# Patient Record
Sex: Female | Born: 1960 | Race: Black or African American | Hispanic: No | Smoking: Former smoker
Health system: Southern US, Community
[De-identification: ages and names within clinical notes are randomized; demographics above are authoritative.]

## PROBLEM LIST (undated history)

## (undated) DIAGNOSIS — E785 Hyperlipidemia, unspecified: Secondary | ICD-10-CM

## (undated) DIAGNOSIS — S060XAA Concussion with loss of consciousness status unknown, initial encounter: Secondary | ICD-10-CM

## (undated) DIAGNOSIS — R600 Localized edema: Secondary | ICD-10-CM

## (undated) DIAGNOSIS — F329 Major depressive disorder, single episode, unspecified: Secondary | ICD-10-CM

## (undated) DIAGNOSIS — G43909 Migraine, unspecified, not intractable, without status migrainosus: Secondary | ICD-10-CM

## (undated) DIAGNOSIS — B20 Human immunodeficiency virus [HIV] disease: Secondary | ICD-10-CM

## (undated) DIAGNOSIS — K649 Unspecified hemorrhoids: Secondary | ICD-10-CM

## (undated) DIAGNOSIS — F209 Schizophrenia, unspecified: Secondary | ICD-10-CM

## (undated) DIAGNOSIS — Z973 Presence of spectacles and contact lenses: Secondary | ICD-10-CM

## (undated) DIAGNOSIS — I10 Essential (primary) hypertension: Secondary | ICD-10-CM

## (undated) DIAGNOSIS — Z87820 Personal history of traumatic brain injury: Secondary | ICD-10-CM

## (undated) DIAGNOSIS — E119 Type 2 diabetes mellitus without complications: Secondary | ICD-10-CM

## (undated) DIAGNOSIS — M545 Low back pain, unspecified: Secondary | ICD-10-CM

## (undated) DIAGNOSIS — F200 Paranoid schizophrenia: Secondary | ICD-10-CM

## (undated) DIAGNOSIS — M4306 Spondylolysis, lumbar region: Secondary | ICD-10-CM

## (undated) DIAGNOSIS — M1990 Unspecified osteoarthritis, unspecified site: Secondary | ICD-10-CM

## (undated) DIAGNOSIS — F319 Bipolar disorder, unspecified: Secondary | ICD-10-CM

## (undated) DIAGNOSIS — D013 Carcinoma in situ of anus and anal canal: Secondary | ICD-10-CM

## (undated) DIAGNOSIS — R0600 Dyspnea, unspecified: Secondary | ICD-10-CM

## (undated) DIAGNOSIS — J302 Other seasonal allergic rhinitis: Secondary | ICD-10-CM

## (undated) DIAGNOSIS — N939 Abnormal uterine and vaginal bleeding, unspecified: Secondary | ICD-10-CM

## (undated) DIAGNOSIS — Z21 Asymptomatic human immunodeficiency virus [HIV] infection status: Secondary | ICD-10-CM

## (undated) DIAGNOSIS — J45909 Unspecified asthma, uncomplicated: Secondary | ICD-10-CM

## (undated) DIAGNOSIS — U071 COVID-19: Secondary | ICD-10-CM

## (undated) DIAGNOSIS — B181 Chronic viral hepatitis B without delta-agent: Secondary | ICD-10-CM

## (undated) DIAGNOSIS — S060X9A Concussion with loss of consciousness of unspecified duration, initial encounter: Secondary | ICD-10-CM

## (undated) DIAGNOSIS — F32A Depression, unspecified: Secondary | ICD-10-CM

## (undated) DIAGNOSIS — Z87410 Personal history of cervical dysplasia: Secondary | ICD-10-CM

## (undated) DIAGNOSIS — K219 Gastro-esophageal reflux disease without esophagitis: Secondary | ICD-10-CM

## (undated) DIAGNOSIS — F4310 Post-traumatic stress disorder, unspecified: Secondary | ICD-10-CM

## (undated) HISTORY — PX: OTHER SURGICAL HISTORY: SHX169

## (undated) HISTORY — DX: Schizophrenia, unspecified: F20.9

## (undated) HISTORY — DX: Bipolar disorder, unspecified: F31.9

## (undated) HISTORY — PX: ABDOMINAL HYSTERECTOMY: SHX81

---

## 1988-12-13 HISTORY — PX: LAPAROSCOPIC SALPINGOOPHERECTOMY: SUR795

## 2003-06-16 DIAGNOSIS — G459 Transient cerebral ischemic attack, unspecified: Secondary | ICD-10-CM

## 2003-06-16 DIAGNOSIS — Z8673 Personal history of transient ischemic attack (TIA), and cerebral infarction without residual deficits: Secondary | ICD-10-CM

## 2003-06-16 HISTORY — DX: Personal history of transient ischemic attack (TIA), and cerebral infarction without residual deficits: Z86.73

## 2003-06-16 HISTORY — DX: Transient cerebral ischemic attack, unspecified: G45.9

## 2007-09-22 ENCOUNTER — Other Ambulatory Visit: Payer: Self-pay

## 2007-09-22 ENCOUNTER — Ambulatory Visit: Payer: Self-pay | Admitting: Obstetrics and Gynecology

## 2007-09-30 ENCOUNTER — Ambulatory Visit: Payer: Self-pay | Admitting: Obstetrics and Gynecology

## 2008-06-18 ENCOUNTER — Emergency Department: Payer: Self-pay | Admitting: Emergency Medicine

## 2010-03-05 LAB — CONVERTED CEMR LAB

## 2010-03-13 LAB — CONVERTED CEMR LAB
Cholesterol: 214 mg/dL
Creatinine, Ser: 0.77 mg/dL
Glucose, Bld: 72 mg/dL
HIV 1 RNA Quant: 78700 copies/mL
Platelets: 243 10*3/uL
Triglyceride fasting, serum: 57 mg/dL
WBC: 3.8 10*3/uL

## 2010-04-22 ENCOUNTER — Ambulatory Visit: Payer: Self-pay | Admitting: Family Medicine

## 2010-05-06 ENCOUNTER — Ambulatory Visit: Payer: Self-pay | Admitting: Adult Health

## 2010-05-06 DIAGNOSIS — R87610 Atypical squamous cells of undetermined significance on cytologic smear of cervix (ASC-US): Secondary | ICD-10-CM

## 2010-05-06 DIAGNOSIS — B20 Human immunodeficiency virus [HIV] disease: Secondary | ICD-10-CM | POA: Insufficient documentation

## 2010-05-06 LAB — CONVERTED CEMR LAB
ALT: 18 units/L (ref 0–35)
AST: 19 units/L (ref 0–37)
Albumin: 4.8 g/dL (ref 3.5–5.2)
Alkaline Phosphatase: 43 units/L (ref 39–117)
BUN: 14 mg/dL (ref 6–23)
Bacteria, UA: NONE SEEN
Basophils Absolute: 0 10*3/uL (ref 0.0–0.1)
Basophils Relative: 0 % (ref 0–1)
Bilirubin Urine: NEGATIVE
CO2: 27 meq/L (ref 19–32)
Calcium: 9.8 mg/dL (ref 8.4–10.5)
Casts: NONE SEEN /lpf
Chlamydia, Swab/Urine, PCR: NEGATIVE
Chloride: 103 meq/L (ref 96–112)
Cholesterol: 217 mg/dL — ABNORMAL HIGH (ref 0–200)
Creatinine, Ser: 0.77 mg/dL (ref 0.40–1.20)
Crystals: NONE SEEN
Eosinophils Absolute: 0 10*3/uL (ref 0.0–0.7)
Eosinophils Relative: 0 % (ref 0–5)
GC Probe Amp, Urine: NEGATIVE
Glucose, Bld: 85 mg/dL (ref 70–99)
HCT: 35.9 % — ABNORMAL LOW (ref 36.0–46.0)
HCV Ab: NEGATIVE
HDL: 64 mg/dL (ref >39)
HIV 1 RNA Quant: 73400 copies/mL — ABNORMAL HIGH (ref <20)
HIV-1 RNA Quant, Log: 4.87 — ABNORMAL HIGH (ref <1.30)
HIV-1 antibody: POSITIVE — AB
HIV-2 Ab: NEGATIVE
Hemoglobin: 11.8 g/dL — ABNORMAL LOW (ref 12.0–15.0)
Hep A Total Ab: POSITIVE — AB
Hep B Core Total Ab: NEGATIVE
Hep B S Ab: NEGATIVE
Hepatitis B Surface Ag: NEGATIVE
Ketones, ur: NEGATIVE mg/dL
LDL Cholesterol: 141 mg/dL — ABNORMAL HIGH (ref 0–99)
Leukocytes, UA: NEGATIVE
Lymphocytes Relative: 44 % (ref 12–46)
Lymphs Abs: 1.4 10*3/uL (ref 0.7–4.0)
MCHC: 32.9 g/dL (ref 30.0–36.0)
MCV: 86.3 fL (ref 78.0–100.0)
Monocytes Absolute: 0.3 10*3/uL (ref 0.1–1.0)
Monocytes Relative: 9 % (ref 3–12)
Neutro Abs: 1.4 10*3/uL — ABNORMAL LOW (ref 1.7–7.7)
Neutrophils Relative %: 47 % (ref 43–77)
Nitrite: NEGATIVE
Pap Smear: ABNORMAL
Platelets: 211 10*3/uL (ref 150–400)
Potassium: 4.2 meq/L (ref 3.5–5.3)
Protein, ur: NEGATIVE mg/dL
RBC: 4.16 M/uL (ref 3.87–5.11)
RDW: 13.7 % (ref 11.5–15.5)
Sodium: 140 meq/L (ref 135–145)
Specific Gravity, Urine: 1.016 (ref 1.005–1.030)
Total Bilirubin: 0.5 mg/dL (ref 0.3–1.2)
Total CHOL/HDL Ratio: 3.4
Total Protein: 7.8 g/dL (ref 6.0–8.3)
Triglycerides: 61 mg/dL (ref <150)
Urine Glucose: NEGATIVE mg/dL
Urobilinogen, UA: 0.2 (ref 0.0–1.0)
VLDL: 12 mg/dL (ref 0–40)
WBC: 3.1 10*3/uL — ABNORMAL LOW (ref 4.0–10.5)
pH: 6 (ref 5.0–8.0)

## 2010-05-21 ENCOUNTER — Ambulatory Visit: Payer: Self-pay | Admitting: Adult Health

## 2010-05-21 DIAGNOSIS — J45909 Unspecified asthma, uncomplicated: Secondary | ICD-10-CM | POA: Insufficient documentation

## 2010-05-21 DIAGNOSIS — K219 Gastro-esophageal reflux disease without esophagitis: Secondary | ICD-10-CM

## 2010-05-21 DIAGNOSIS — E785 Hyperlipidemia, unspecified: Secondary | ICD-10-CM | POA: Insufficient documentation

## 2010-05-21 DIAGNOSIS — Z8709 Personal history of other diseases of the respiratory system: Secondary | ICD-10-CM | POA: Insufficient documentation

## 2010-06-05 ENCOUNTER — Ambulatory Visit: Payer: Self-pay | Admitting: Adult Health

## 2010-06-05 DIAGNOSIS — K644 Residual hemorrhoidal skin tags: Secondary | ICD-10-CM | POA: Insufficient documentation

## 2010-06-20 ENCOUNTER — Encounter: Payer: Self-pay | Admitting: Adult Health

## 2010-06-23 ENCOUNTER — Ambulatory Visit
Admission: RE | Admit: 2010-06-23 | Discharge: 2010-06-23 | Payer: Self-pay | Source: Home / Self Care | Attending: Adult Health | Admitting: Adult Health

## 2010-06-23 ENCOUNTER — Encounter: Payer: Self-pay | Admitting: Adult Health

## 2010-06-23 LAB — CONVERTED CEMR LAB
ALT: 24 units/L (ref 0–35)
AST: 20 units/L (ref 0–37)
Albumin: 4.7 g/dL (ref 3.5–5.2)
Alkaline Phosphatase: 50 units/L (ref 39–117)
BUN: 13 mg/dL (ref 6–23)
Basophils Absolute: 0 10*3/uL (ref 0.0–0.1)
Basophils Relative: 0 % (ref 0–1)
CO2: 26 meq/L (ref 19–32)
Calcium: 9.5 mg/dL (ref 8.4–10.5)
Chloride: 104 meq/L (ref 96–112)
Creatinine, Ser: 0.79 mg/dL (ref 0.40–1.20)
Eosinophils Absolute: 0 10*3/uL (ref 0.0–0.7)
Eosinophils Relative: 1 % (ref 0–5)
Glucose, Bld: 89 mg/dL (ref 70–99)
HCT: 37.3 % (ref 36.0–46.0)
HIV 1 RNA Quant: 78 copies/mL — ABNORMAL HIGH (ref <20)
HIV-1 RNA Quant, Log: 1.89 — ABNORMAL HIGH (ref <1.30)
Hemoglobin: 12.1 g/dL (ref 12.0–15.0)
Lymphocytes Relative: 34 % (ref 12–46)
Lymphs Abs: 1 10*3/uL (ref 0.7–4.0)
MCHC: 32.4 g/dL (ref 30.0–36.0)
MCV: 87.1 fL (ref 78.0–100.0)
Monocytes Absolute: 0.2 10*3/uL (ref 0.1–1.0)
Monocytes Relative: 6 % (ref 3–12)
Neutro Abs: 1.8 10*3/uL (ref 1.7–7.7)
Neutrophils Relative %: 59 % (ref 43–77)
Platelets: 241 10*3/uL (ref 150–400)
Potassium: 4.6 meq/L (ref 3.5–5.3)
RBC: 4.28 M/uL (ref 3.87–5.11)
RDW: 14 % (ref 11.5–15.5)
Sodium: 141 meq/L (ref 135–145)
Total Bilirubin: 0.3 mg/dL (ref 0.3–1.2)
Total Protein: 7.8 g/dL (ref 6.0–8.3)
WBC: 3 10*3/uL — ABNORMAL LOW (ref 4.0–10.5)

## 2010-06-30 LAB — T-HELPER CELL (CD4) - (RCID CLINIC ONLY)
CD4 % Helper T Cell: 37 % (ref 33–55)
CD4 T Cell Abs: 390 uL — ABNORMAL LOW (ref 400–2700)

## 2010-07-07 ENCOUNTER — Ambulatory Visit: Admit: 2010-07-07 | Payer: Self-pay | Admitting: Adult Health

## 2010-07-08 ENCOUNTER — Telehealth: Payer: Self-pay | Admitting: Adult Health

## 2010-07-15 NOTE — Miscellaneous (Signed)
Summary: HIPAA Restrictions  HIPAA Restrictions   Imported By: Florinda Marker 05/13/2010 09:23:38  _____________________________________________________________________  External Attachment:    Type:   Image     Comment:   External Document

## 2010-07-15 NOTE — Assessment & Plan Note (Addendum)
Summary: Nurse Visit (Infectious Disease)   Infectious Disease New Patient Intake Referring MD/Agency: Dr Vevelyn Royals  Address:  [address] [place], [place] [address]  Return Appointment Date: 05/28/2010  With Physician: Traci Sermon, NP Medical Records: Received Health Insurance / Payor: Private    Do you have a Primary physician: Yes Physician Name: [facility]   City/State: [place], [place]  Are family members aware of patient's diagnosis?  If so, are they supportive? He states she has told her son.   Medical History Medical Hx Comments: Pt is not a very good historian.  She tends to ramble from subject to subject. At one point she says she does not have HIV and then says at least it can be cured.  She states history of problems with fibroids and abnormal pap smear but she has seen a specialist for this.    Family History Hypertension: Yes  Family Side: Maternal Diabetes: Yes  Family Side: Maternal  Tobacco use: current Amt: 1 per day.  Behavioral Health Assessment Have you ever been diagnosed with depression or mental illness? Yes  Diagnosis: Pt states she was seeing a Psychiatrist at some point but he said she was fine and let her go.  Do you drink alcohol? Yes Last Date of Consumption: 03/25/2010 Frequency: week end Alcohol Beverage Type(s): beer /alcohol  Do you use recreational drugs? No Do you feel you have a problem with drugs and/or alcohol? No   Have you ever been in a treatment facility for any addiction? No Behavioral Health Comments: I suspect pt does have some form of mental health disorder. She is able to maintain employment and she drives.  She is a very pleasant person with a bubbly personality. I am not sure she understands the full meaning of being diagnosed with HIV. I spent 30 mins with patient explaining in simple terms how it is transmitted , symptoms, and disease process. Yet she says I can take medications  and be cured from this right?  HIV Intake Information When did you first test positive for HIV? 03/13/2010 Where was this test performed?  Name of Agency: [facility]  City/State: [place], [place]  [place]: [place] Was this your first time ever being tested or HIV? No   LAST negative HIV test result: 09/2001 Name of Office: [facility]   City/state: [place], [place]   Risk Factor(s) for HIV: Heterosexual contact  Method of Exposure to HIV: Heterosexual Intercourse Have you ever been hospitalized for any HIV-related condition? No  Have you ever been under the care of a physician for being HIV positive? No  Newly Diagnosed Patients Has a Disease Intervention Specialist from the Health Department contacted the patient? Yes.   The patient has been informed that the [facility] will contact ALL newly reported cases. Health Department Contact:  [phone]   (SSN is needed for confirmation)  Reported Today: 05/06/2010 Health Department Contact:  [phone]            (SSN is needed for confirmation)  Person Reporting: Tammy King,RN   If yes, please explain: Poss. uterine surgery   HIV Medications Information The patient is currently NOT taking any HIV medications.  Infection History  Patient has been diagnosed with the following opportunistic infections: Are there any other symptoms you need to discuss? No Have you received literature/education prior to this visit about HIV/AIDS? No Do you understand the meaning of a Viral Load? No Do  you understand the meaning of a CD4 count? No Lab Values Education/Handout Given Yes Medication Education/Handout Given Yes  Sexual History Are you in a current relationship? Yes Are they aware of your diagnosis? Yes Have they been tested for HIV? No What were the results: Unknown Sexual History Comments: Pt is involved in a relationship with two males who both have cancer.  She states she has pain with sexual  intercourse and only performs oral sex with them. Her relationship with these 2 men has been going on for at least 4 years per the patient.  She does not know how she could have HIV when she was mainly performing oral sex and her partners all go the the [facility] for check ups.   Evaluation and Follow-Up INTAKE CHECK LIST: HIV Education, Safe Sex Counseling, HIV Material Given  Prevention For Positives: 05/06/2010   Safe sex practices discussed with patient. Condoms offered. Juanell Fairly Consent: Yes Are you in need of condoms at this time? No Our patient has been informed that condoms are always available in this clinic.   Are you involved in any social organization? [facility] Does patient have problems that warrant Social Worker referral? No  Obstetric/Gynecological History Pap Smear Date: 03/13/2010 Result:  abnormal Office/Clinic:  [facility]    Hysterectomy:  No  Pregnancy History Gravida: 3 Para: 3  Immunization History:  Influenza Immunization History:    Influenza:  historical (03/13/2010)  Immunizations Administered:  PPD Skin Test:    Vaccine Type: PPD    Site: left forearm    Mfr: Sanofi Pasteur    Dose: 0.05 ml    Route: ID    Given by: Tomasita Morrow RN    Exp. Date: 12/20/2011    Lot #: [id number]  Pneumonia Vaccine:    Vaccine Type: Pneumovax    Site: right deltoid    Mfr: Merck    Dose: 0.5 ml    Route: IM    Given by: Tomasita Morrow RN    Exp. Date: 10/15/2011    Lot #: [id number]    VIS given: 05/20/09 version given May 06, 2010.  Tetanus Vaccine:    Vaccine Type: Td    Site: left deltoid    Mfr: Sanofi Pasteur    Dose: 0.5 ml    Given by: Tomasita Morrow RN    Exp. Date: 07/11/2011    Lot #: [id number]    VIS given: 05/02/08 version given May 06, 2010.  PPD Results    Date of reading: 05/08/2010    Results: < 5mm    Interpretation: negative    -  Date:  03/13/2010    Viral Load: 78700    Glucose:  72    WBC: 3.8    Platelets: 243    Creatinine: .77    Cholesterol: 214    Triglycerides: 57    Pap Smear  Procedure date:  03/05/2010  Findings:      [facility]   Pap Smear  Procedure date:  03/05/2010  Findings:      Abnormal Pap  : epithelial cell abnormality , atypical squamous cells of undetermined significance. Fungal organism morphologically consistent with candida species are present.  F/u suggested.            Prevention For Positives: 05/06/2010   Safe sex practices discussed with patient. Condoms offered.        05/06/2010   Patient was screened for substance abuse and depression. Referal  was made as indicated.

## 2010-07-15 NOTE — Assessment & Plan Note (Signed)
Summary: New 042 Sara Mcintyre   Primary Larone Kliethermes:  Talmadge Chad NP  CC:  new patient 042.  History of Present Illness: 50 y/o African-American female diagnosed HIV (+) in October 2011 in for initial evaluation post intake.  Was tested for HIV during routine physical examination in 03/2010 by PCP.  She denies any known sequela of HIV.  She was diagnosed with cervical dysplasia and HPV infection and underwent laser surgery in October 2011 for removal of dysplastic tissue and HPV lesions as well as removal of uterine fibroids, according to her.  She claims she has been in a general state of good health, except for a period "a few years back" when she had what she was told a "viral pneumonia" from which she recovered unremarkably.  She also admits to "some weight loss" but claims it was intentional as recommended by her physicians.  Currently she voices no physical complaints.  She is a good historian and accurately describes her past history timeline.  Preventive Screening-Counseling & Management  Alcohol-Tobacco     Alcohol drinks/day: 0     Smoking Status: quit  Caffeine-Diet-Exercise     Does Patient Exercise: no      Drug Use:  no.    Past History:  Past Medical History: Asthma - as a child, not current Pneumonia, hx of - viral 2-3 years back Cervical dysplasia with HPV - 2011 Uterine cyst and PID at 27 YOA Hyperlipidemia GERD - 20 years back, not recent  Past Surgical History: Carpal tunnel release - 15 years ago GYN surgery - Right Salpingo-oopherectomy 27 yoa GYN surgery - laser ablation of HPV lesion and cervical dysplasia with uterine fibroid removal - 03/2010  Family History: Family History of Cervical cancer - mother Family History of Prostate CA 1st degree relative <50 - father Family History Hypertension - both parents  Social History: Occupation: Psychiatric nurse Single Former Smoker Alcohol use-no Drug use-no Regular exercise-no Drug Use:  no  Additional  History Menstrual Status:  irregular  Review of Systems General:  Complains of weight loss; denies chills, fatigue, fever, loss of appetite, malaise, sleep disorder, sweats, and weakness; weight loss claimed as "intentional". Eyes:  Denies blurring, discharge, double vision, eye irritation, eye pain, halos, itching, light sensitivity, red eye, vision loss-1 eye, and vision loss-both eyes; Was told by PCP she may have cataracts, but had recent eye exam without diagnosis of same.  Does wear reading glasses.. ENT:  Denies decreased hearing, difficulty swallowing, ear discharge, earache, hoarseness, nasal congestion, nosebleeds, postnasal drainage, ringing in ears, sinus pressure, and sore throat. CV:  Denies bluish discoloration of lips or nails, chest pain or discomfort, difficulty breathing at night, difficulty breathing while lying down, fainting, fatigue, leg cramps with exertion, lightheadness, near fainting, palpitations, shortness of breath with exertion, swelling of feet, swelling of hands, and weight gain. Resp:  Denies chest discomfort, chest pain with inspiration, cough, coughing up blood, excessive snoring, hypersomnolence, morning headaches, pleuritic, shortness of breath, sputum productive, and wheezing. GI:  Complains of indigestion; denies abdominal pain, bloody stools, change in bowel habits, constipation, dark tarry stools, diarrhea, excessive appetite, gas, hemorrhoids, loss of appetite, nausea, vomiting, vomiting blood, and yellowish skin color; occassioinal indigestion after eating spicy or fatty foods, but not regular or recurrent.. GU:  Denies abnormal vaginal bleeding, decreased libido, discharge, dysuria, genital sores, hematuria, incontinence, nocturia, urinary frequency, and urinary hesitancy. MS:  Complains of low back pain; denies joint pain, joint redness, joint swelling, loss of strength, mid back pain,  muscle aches, muscle , cramps, muscle weakness, stiffness, and thoracic  pain; Chronic low back pain, was told she had sciatic nerve pinched, but denies any radiculopathy.. Derm:  Denies changes in color of skin, changes in nail beds, dryness, excessive perspiration, flushing, hair loss, insect bite(s), itching, lesion(s), poor wound healing, and rash. Neuro:  Complains of numbness; denies brief paralysis, difficulty with concentration, disturbances in coordination, falling down, headaches, inability to speak, memory loss, poor balance, seizures, sensation of room spinning, tingling, tremors, visual disturbances, and weakness; Intermittent numbness to right from h/o carpal tunnel syndrome in right hand. Psych:  Denies alternate hallucination ( auditory/visual), anxiety, depression, easily angered, easily tearful, irritability, mental problems, panic attacks, sense of great danger, suicidal thoughts/plans, thoughts of violence, unusual visions or sounds, and thoughts /plans of harming others. Endo:  Denies cold intolerance, excessive hunger, excessive thirst, excessive urination, heat intolerance, polyuria, and weight change. Heme:  Denies abnormal bruising, bleeding, enlarge lymph nodes, fevers, pallor, and skin discoloration. Allergy:  Denies hives or rash, itching eyes, persistent infections, seasonal allergies, and sneezing. Exposures:  Denies HIV exposure, EBV exposure, TB exposure, exposure to sick animals, exposure to sick people, exposure to unusual animals, exposure to small children, exposure to caves/spelunking, exposure to bats, exposure to hunting/wild game, exposure to stagnant or pond water, exposure to salt water, exposure to marine animals/shellfish, animal bites, cat scratches, tick bites, eating raw eggs, eating raw chicken, eating raw fish/shellfish, blood transfusion, ingestion of well water, water vapor exposure, history of needle use/puncture, history of antibiotic use (last 2 mo.), and history of travel.  Vital Signs:  Patient profile:   50 year old  female Menstrual status:  irregular Height:      64 inches (162.56 cm) Weight:      132.25 pounds (60.11 kg) BMI:     22.78 Temp:     98.4 degrees F (36.89 degrees C) oral Pulse rate:   70 / minute BP sitting:   115 / 76  (left arm)  Vitals Entered By: Starleen Arms CMA (May 21, 2010 2:14 PM) CC: new patient 042 Is Patient Diabetic? No Pain Assessment Patient in pain? no      Nutritional Status BMI of 19 -24 = normal Nutritional Status Detail nl  Does patient need assistance? Functional Status Self care Ambulation Normal     Menstrual Status irregular   Physical Exam  General:  Well-developed,well-nourished,in no acute distress; alert,appropriate and cooperative throughout examination Head:  Normocephalic and atraumatic without obvious abnormalities. No apparent alopecia or balding. Eyes:  No corneal or conjunctival inflammation noted. EOMI. Perrla. Funduscopic exam benign, without hemorrhages, exudates or papilledema. Vision grossly normal. Ears:  External ear exam shows no significant lesions or deformities.  Otoscopic examination reveals clear canals, tympanic membranes are intact bilaterally without bulging, retraction, inflammation or discharge. Hearing is grossly normal bilaterally. Nose:  External nasal examination shows no deformity or inflammation. Nasal mucosa are pink and moist without lesions or exudates. Mouth:  Oral mucosa and oropharynx without lesions or exudates, teeth missing.   Neck:  No deformities, masses, or tenderness noted. Chest Wall:  No deformities, masses, or tenderness noted. Breasts:  No mass,nodules,thickening,tenderness,bulging,retraction,inflamation,nipple discharge or skin changes noted.  last mammogram 1 month back (reported as normal) Lungs:  Normal respiratory effort, chest expands symmetrically. Lungs are clear to auscultation, no crackles or wheezes. Heart:  Normal rate and regular rhythm. S1 and S2 normal without gallop, murmur,  click, rub or other extra sounds. Abdomen:  Bowel sounds positive,abdomen  soft and non-tender without masses, organomegaly or hernias noted. Rectal:  Deferred Genitalia:  Deferred Msk:  No deformity or scoliosis noted of thoracic or lumbar spine.   Pulses:  R and L carotid,radial,femoral,dorsalis pedis and posterior tibial pulses are full and equal bilaterally Extremities:  No clubbing, cyanosis, edema, or deformity noted with normal full range of motion of all joints.   Neurologic:  No cranial nerve deficits noted. Station and gait are normal. Plantar reflexes are down-going bilaterally. DTRs are symmetrical throughout. Sensory, motor and coordinative functions appear intact. Skin:  Intact without suspicious lesions or rashes Cervical Nodes:  No lymphadenopathy noted Axillary Nodes:  Small shoddy axillary LN's Inguinal Nodes:  No significant adenopathy Psych:  Cognition and judgment appear intact. Alert and cooperative with normal attention span and concentration. No apparent delusions, illusions, hallucinations   Impression & Recommendations:  Problem # 1:  HIV INFECTION (ICD-042) CD4 390 @ 32% with HIV RNA 73, 400 copies/ml @ log 4.87 per PCR.  Clinically no indications of opportunistic sequela noted.  Her CD4% is robust at 32%, but her total CD4 is <500 and she does have h/o cervical dysplasia and HPV infection.  This along with her age would warrant strong recommendation for ARV therapy.  We spent >20 minutes discussing in detail her HIV infection, HIV pathogenesis, and treatment strategies.  She had remarkable comprehension and understanding in terms she used regarding this discussioin and admitted she is willing to start therapy.  We reviewed treatment options with her and decided the safetst and most tolerant regimen for her would be Truvada and Isentress.  An Rx was written for each, co-pay assist cards were provided for both medications, a pill box was dispensed and the regimen  re-reviewed with good return verbalized understanding of the regimen.  She is instructed to RTC in 4 weeks for labs with a scheduled f/u in 6 weeks.  She is strongly encouraged to contact the clinic if she has any questions or problems. Orders: New Patient Level V (99205)Future Orders: T-CBC w/Diff ([id number]) ... 06/18/2010 T-CD4SP ([facility]) (CD4SP) ... 06/18/2010 T-Comprehensive Metabolic Panel [id number]) ... 06/18/2010 T-HIV Viral Load [id number]) ... 06/18/2010  Medications Added to Medication List This Visit: 1)  Isentress 400 Mg Tabs (Raltegravir potassium) .... Take one (1) tablet every twelve (12) hours 2)  Truvada 200-300 Mg Tabs (Emtricitabine-tenofovir) .... Take one (1) tablet once a day  Other Orders: Hepatitis B Vaccine >70yrs (57846) Admin 1st Vaccine ([address]295)  Patient Instructions: 1)  Take Isentress 1 tablet every 12 hours 2)  Take Truvada 1 tablet once daily 3)  Return to clinic for labs in 4 weeks 4)  Schedule follow-up visit in 6 weeks. 5)  Call clinic if you are experiencing any problems with medications. Prescriptions: TRUVADA 200-300 MG TABS (EMTRICITABINE-TENOFOVIR) Take one (1) tablet once a day  #30 x 5   Entered and Authorized by:   Talmadge Chad NP   Signed by:   Talmadge Chad NP on 05/21/2010   Method used:   Print then Give to Patient   RxID:   [id number] ISENTRESS 400 MG TABS (RALTEGRAVIR POTASSIUM) Take one (1) tablet every twelve (12) hours  #60 x 5   Entered and Authorized by:   Talmadge Chad NP   Signed by:   Talmadge Chad NP on 05/21/2010   Method used:   Print then Give to Patient   RxID:   [id number]    Immunization History:  Influenza Immunization History:    Influenza:  historical (  04/15/2010)  Immunizations Administered:  Hepatitis B Vaccine # 1:    Vaccine Type: HepB Adult    Site: left deltoid    Mfr: Merck    Dose: 0.5 ml    Route: IM    Given by: Wendall Mola CMA ( AAMA)     Exp. Date: 06/02/2012    Lot #: [id number]    VIS given: 12/30/05 version given May 21, 2010.

## 2010-07-17 NOTE — Progress Notes (Signed)
Summary: concerns about appt.   Phone Note Call from Patient   Summary of Call: Pt called today c/o does she still need to see Nida Boatman since she has seen the company nurse who suggested she get a Primary Care physician. Pt was advised she must reschedule appt with Brad and it is ok to establish with PC for other problems .  Initial call taken by: Tomasita Morrow RN,  July 08, 2010 2:36 PM
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
## 2010-07-17 NOTE — Miscellaneous (Signed)
Summary: RW update  Clinical Lists Changes  Observations: Added new observation of RWPARTICIP: Yes (06/20/2010 13:51)

## 2010-07-25 ENCOUNTER — Ambulatory Visit: Payer: BC Managed Care – PPO | Admitting: Adult Health

## 2010-07-25 ENCOUNTER — Encounter: Payer: Self-pay | Admitting: Adult Health

## 2010-08-06 NOTE — Assessment & Plan Note (Signed)
Summary: f/u lab   Vital Signs:  Patient profile:   50 year old female Menstrual status:  irregular Height:      64 inches Weight:      136.4 pounds BMI:     23.50 Temp:     97.7 degrees F oral Pulse rate:   87 / minute BP sitting:   110 / 73  (right arm)  Vitals Entered By: Alesia Morin CMA (July 25, 2010 4:02 PM) CC: follow-up visit for labs Is Patient Diabetic? No Pain Assessment Patient in pain? no      Nutritional Status BMI of 19 -24 = normal Nutritional Status Detail appetite "good"  Have you ever been in a relationship where you felt threatened, hurt or afraid?No   Does patient need assistance? Functional Status Self care Ambulation Normal Comments she has missed 7days waiting on the med delivery   Primary Provider:  Talmadge Chad NP  CC:  follow-up visit for labs.  History of Present Illness: Doing very well.  Hemorrhoid pain abated after starting Metamucil therapy.  Adherent with meds with good tolerance.  No SE's reported.  Preventive Screening-Counseling & Management  Alcohol-Tobacco     Alcohol drinks/day: 0     Smoking Status: quit  Caffeine-Diet-Exercise     Caffeine use/day: coffee, soda 1 per day     Does Patient Exercise: no  Safety-Violence-Falls     Seat Belt Use: yes      Sexual History:  currently monogamous.        Drug Use:  no.    Comments: pt accepted the condoms  Allergies (verified): No Known Drug Allergies  Social History: Sexual History:  currently monogamous  Review of Systems  The patient denies anorexia, fever, weight loss, weight gain, vision loss, decreased hearing, hoarseness, chest pain, syncope, dyspnea on exertion, peripheral edema, prolonged cough, headaches, hemoptysis, abdominal pain, melena, hematochezia, severe indigestion/heartburn, hematuria, incontinence, genital sores, muscle weakness, suspicious skin lesions, transient blindness, difficulty walking, depression, unusual weight change, abnormal  bleeding, enlarged lymph nodes, angioedema, and breast masses.    Physical Exam  General:  alert, well-developed, well-nourished, and well-hydrated.   Head:  normocephalic and atraumatic.   Eyes:  vision grossly intact, pupils equal, pupils round, and pupils reactive to light.   Ears:  R ear normal and L ear normal.   Mouth:  pharynx pink and moist, poor dentition, and teeth missing.   Neck:  supple and full ROM.   Lungs:  normal respiratory effort, no intercostal retractions, and normal breath sounds.   Heart:  normal rate, regular rhythm, no murmur, no gallop, and no rub.   Abdomen:  soft, non-tender, normal bowel sounds, no hepatomegaly, and no splenomegaly.   Msk:  normal ROM, no joint tenderness, no joint swelling, and no redness over joints.   Extremities:  No clubbing, cyanosis, edema, or deformity noted with normal full range of motion of all joints.   Neurologic:  alert & oriented X3, cranial nerves II-XII intact, strength normal in all extremities, and gait normal.   Skin:  turgor normal, color normal, no rashes, and no suspicious lesions.   Psych:  Oriented X3, memory intact for recent and remote, normally interactive, good eye contact, not anxious appearing, and not depressed appearing.      Impression & Recommendations:  Problem # 1:  HIV INFECTION (ICD-042) CD4 390@37 % showing % increase of 5% although absolute value remains unchanged.  HIV RNA 78 copies/ml per PCR showing a >3 log decline after being  on therapy for approx. 8 weeks showing good virologi response to treatment.  Will continue present management with Truvada and Isentress, repeat staging labs in 6 weeks with f/u in 2 months.  If progress continues we will increase visits to q 3months and eventually q 6 months.  Verbally acknowledged and agreed with plan.  Medications Added to Medication List This Visit: 1)  Truvada 200-300 Mg Tabs (Emtricitabine-tenofovir) .Marland Kitchen.. 1 tab once daily

## 2010-08-26 LAB — T-HELPER CELL (CD4) - (RCID CLINIC ONLY)
CD4 % Helper T Cell: 32 % — ABNORMAL LOW (ref 33–55)
CD4 T Cell Abs: 390 uL — ABNORMAL LOW (ref 400–2700)

## 2010-09-09 ENCOUNTER — Other Ambulatory Visit: Payer: BC Managed Care – PPO

## 2010-09-09 DIAGNOSIS — B20 Human immunodeficiency virus [HIV] disease: Secondary | ICD-10-CM

## 2010-09-10 LAB — COMPLETE METABOLIC PANEL WITH GFR
ALT: 23 U/L (ref 0–35)
AST: 21 U/L (ref 0–37)
Albumin: 4.5 g/dL (ref 3.5–5.2)
Alkaline Phosphatase: 56 U/L (ref 39–117)
BUN: 21 mg/dL (ref 6–23)
CO2: 25 mEq/L (ref 19–32)
Calcium: 9.2 mg/dL (ref 8.4–10.5)
Chloride: 102 mEq/L (ref 96–112)
Creat: 0.95 mg/dL (ref 0.40–1.20)
GFR, Est African American: >60 mL/min (ref >60)
GFR, Est Non African American: >60 mL/min (ref >60)
Glucose, Bld: 124 mg/dL — ABNORMAL HIGH (ref 70–99)
Potassium: 4.3 mEq/L (ref 3.5–5.3)
Sodium: 135 mEq/L (ref 135–145)
Total Bilirubin: 0.3 mg/dL (ref 0.3–1.2)
Total Protein: 7 g/dL (ref 6.0–8.3)

## 2010-09-10 LAB — CBC WITH DIFFERENTIAL/PLATELET
Basophils Absolute: 0 10*3/uL (ref 0.0–0.1)
Basophils Relative: 0 % (ref 0–1)
Eosinophils Absolute: 0 10*3/uL (ref 0.0–0.7)
Eosinophils Relative: 1 % (ref 0–5)
HCT: 36.3 % (ref 36.0–46.0)
Hemoglobin: 12 g/dL (ref 12.0–15.0)
Lymphocytes Relative: 30 % (ref 12–46)
Lymphs Abs: 1.3 10*3/uL (ref 0.7–4.0)
MCH: 28.4 pg (ref 26.0–34.0)
MCHC: 33.1 g/dL (ref 30.0–36.0)
MCV: 86 fL (ref 78.0–100.0)
Monocytes Absolute: 0.3 10*3/uL (ref 0.1–1.0)
Monocytes Relative: 7 % (ref 3–12)
Neutro Abs: 2.8 10*3/uL (ref 1.7–7.7)
Neutrophils Relative %: 62 % (ref 43–77)
Platelets: 257 10*3/uL (ref 150–400)
RBC: 4.22 MIL/uL (ref 3.87–5.11)
RDW: 13.9 % (ref 11.5–15.5)
WBC: 4.4 10*3/uL (ref 4.0–10.5)

## 2010-09-10 LAB — HIV-1 RNA QUANT-NO REFLEX-BLD
HIV 1 RNA Quant: 27 copies/mL — ABNORMAL HIGH (ref <20)
HIV-1 RNA Quant, Log: 1.43 {Log} — ABNORMAL HIGH (ref <1.30)

## 2010-09-11 LAB — T-HELPER CELL (CD4) - (RCID CLINIC ONLY)
CD4 % Helper T Cell: 44 % (ref 33–55)
CD4 T Cell Abs: 580 uL (ref 400–2700)

## 2010-09-23 ENCOUNTER — Ambulatory Visit (INDEPENDENT_AMBULATORY_CARE_PROVIDER_SITE_OTHER): Payer: BC Managed Care – PPO | Admitting: Adult Health

## 2010-09-23 ENCOUNTER — Encounter: Payer: Self-pay | Admitting: Adult Health

## 2010-09-23 VITALS — BP 121/75 | HR 67 | Temp 98.3°F | Ht 62.0 in | Wt 141.0 lb

## 2010-09-23 DIAGNOSIS — Z21 Asymptomatic human immunodeficiency virus [HIV] infection status: Secondary | ICD-10-CM

## 2010-09-23 DIAGNOSIS — B20 Human immunodeficiency virus [HIV] disease: Secondary | ICD-10-CM

## 2010-09-24 NOTE — Progress Notes (Signed)
  Subjective:    Patient ID: Sara Mcintyre, female    DOB: 1961/01/28, 50 y.o.   MRN: [id number]  HPI In for lab f/u.  Doing very well.  Adherent to meds with good tolerance.  Voices no complaints.    Review of Systems  Constitutional: Negative.   HENT: Negative.   Eyes: Negative.   Respiratory: Negative.   Cardiovascular: Negative.   Gastrointestinal: Negative.   Genitourinary: Negative.   Musculoskeletal: Negative.   Skin: Negative.   Neurological: Negative.   Hematological: Negative.   Psychiatric/Behavioral: Negative.        Objective:   Physical Exam  Constitutional: She is oriented to person, place, and time. She appears well-developed and well-nourished. No distress.  HENT:  Head: Normocephalic and atraumatic.  Right Ear: External ear normal.  Left Ear: External ear normal.  Nose: Nose normal.  Mouth/Throat: Oropharynx is clear and moist.       Fair dentition.  Eyes: Conjunctivae are normal. Pupils are equal, round, and reactive to light.  Neck: Normal range of motion. Neck supple.  Cardiovascular: Normal rate, regular rhythm, normal heart sounds and intact distal pulses.   Pulmonary/Chest: Effort normal and breath sounds normal.  Abdominal: Soft. Bowel sounds are normal.  Musculoskeletal: Normal range of motion.  Neurological: She is alert and oriented to person, place, and time. Coordination normal.  Skin: Skin is warm and dry.  Psychiatric: She has a normal mood and affect. Her behavior is normal. Judgment and thought content normal.          Assessment & Plan:  HIV:

## 2010-12-03 ENCOUNTER — Other Ambulatory Visit: Payer: Self-pay | Admitting: Licensed Clinical Social Worker

## 2010-12-03 DIAGNOSIS — B20 Human immunodeficiency virus [HIV] disease: Secondary | ICD-10-CM

## 2010-12-03 MED ORDER — RALTEGRAVIR POTASSIUM 400 MG PO TABS: 400.0000 mg | ORAL_TABLET | Freq: Two times a day (BID) | ORAL | Status: DC

## 2010-12-03 MED ORDER — EMTRICITABINE-TENOFOVIR DF 200-300 MG PO TABS: 1.0000 | ORAL_TABLET | Freq: Every day | ORAL | Status: DC

## 2011-01-15 ENCOUNTER — Other Ambulatory Visit (INDEPENDENT_AMBULATORY_CARE_PROVIDER_SITE_OTHER): Payer: BC Managed Care – PPO

## 2011-01-15 DIAGNOSIS — B20 Human immunodeficiency virus [HIV] disease: Secondary | ICD-10-CM

## 2011-01-16 LAB — CBC WITH DIFFERENTIAL/PLATELET
Basophils Absolute: 0 10*3/uL (ref 0.0–0.1)
Basophils Relative: 0 % (ref 0–1)
Eosinophils Absolute: 0 10*3/uL (ref 0.0–0.7)
Eosinophils Relative: 1 % (ref 0–5)
HCT: 38.8 % (ref 36.0–46.0)
Hemoglobin: 12.6 g/dL (ref 12.0–15.0)
Lymphocytes Relative: 36 % (ref 12–46)
Lymphs Abs: 1.6 10*3/uL (ref 0.7–4.0)
MCH: 28.8 pg (ref 26.0–34.0)
MCHC: 32.5 g/dL (ref 30.0–36.0)
MCV: 88.8 fL (ref 78.0–100.0)
Monocytes Absolute: 0.3 10*3/uL (ref 0.1–1.0)
Monocytes Relative: 6 % (ref 3–12)
Neutro Abs: 2.6 10*3/uL (ref 1.7–7.7)
Neutrophils Relative %: 57 % (ref 43–77)
Platelets: 260 10*3/uL (ref 150–400)
RBC: 4.37 MIL/uL (ref 3.87–5.11)
RDW: 13.4 % (ref 11.5–15.5)
WBC: 4.6 10*3/uL (ref 4.0–10.5)

## 2011-01-16 LAB — COMPLETE METABOLIC PANEL WITH GFR
ALT: 19 U/L (ref 0–35)
AST: 22 U/L (ref 0–37)
Albumin: 4.4 g/dL (ref 3.5–5.2)
Alkaline Phosphatase: 58 U/L (ref 39–117)
BUN: 15 mg/dL (ref 6–23)
CO2: 29 mEq/L (ref 19–32)
Calcium: 9.6 mg/dL (ref 8.4–10.5)
Chloride: 107 mEq/L (ref 96–112)
Creat: 0.96 mg/dL (ref 0.50–1.10)
GFR, Est African American: >60 mL/min (ref >60)
GFR, Est Non African American: >60 mL/min (ref >60)
Glucose, Bld: 77 mg/dL (ref 70–99)
Potassium: 4.6 mEq/L (ref 3.5–5.3)
Sodium: 143 mEq/L (ref 135–145)
Total Bilirubin: 0.3 mg/dL (ref 0.3–1.2)
Total Protein: 7.5 g/dL (ref 6.0–8.3)

## 2011-01-16 LAB — T-HELPER CELL (CD4) - (RCID CLINIC ONLY)
CD4 % Helper T Cell: 46 % (ref 33–55)
CD4 T Cell Abs: 770 uL (ref 400–2700)

## 2011-01-19 LAB — HIV-1 RNA QUANT-NO REFLEX-BLD
HIV 1 RNA Quant: <20 copies/mL (ref <20)
HIV-1 RNA Quant, Log: <1.3 {Log} (ref <1.30)

## 2011-01-26 ENCOUNTER — Other Ambulatory Visit: Payer: Self-pay | Admitting: *Deleted

## 2011-01-26 DIAGNOSIS — B20 Human immunodeficiency virus [HIV] disease: Secondary | ICD-10-CM

## 2011-01-26 MED ORDER — EMTRICITABINE-TENOFOVIR DF 200-300 MG PO TABS: 1.0000 | ORAL_TABLET | Freq: Every day | ORAL | Status: DC

## 2011-01-29 ENCOUNTER — Ambulatory Visit (INDEPENDENT_AMBULATORY_CARE_PROVIDER_SITE_OTHER): Payer: BC Managed Care – PPO | Admitting: Adult Health

## 2011-01-29 VITALS — Temp 98.0°F | Ht 62.0 in | Wt 143.0 lb

## 2011-01-29 DIAGNOSIS — B20 Human immunodeficiency virus [HIV] disease: Secondary | ICD-10-CM

## 2011-01-29 DIAGNOSIS — Z23 Encounter for immunization: Secondary | ICD-10-CM

## 2011-05-27 ENCOUNTER — Other Ambulatory Visit: Payer: Self-pay | Admitting: Infectious Diseases

## 2011-05-27 ENCOUNTER — Other Ambulatory Visit: Payer: BC Managed Care – PPO

## 2011-05-27 DIAGNOSIS — B20 Human immunodeficiency virus [HIV] disease: Secondary | ICD-10-CM

## 2011-05-28 LAB — COMPREHENSIVE METABOLIC PANEL
ALT: 22 U/L (ref 0–35)
AST: 24 U/L (ref 0–37)
Albumin: 4.8 g/dL (ref 3.5–5.2)
Alkaline Phosphatase: 57 U/L (ref 39–117)
BUN: 10 mg/dL (ref 6–23)
CO2: 28 mEq/L (ref 19–32)
Calcium: 9.3 mg/dL (ref 8.4–10.5)
Chloride: 102 mEq/L (ref 96–112)
Creat: 0.81 mg/dL (ref 0.50–1.10)
Glucose, Bld: 80 mg/dL (ref 70–99)
Potassium: 4 mEq/L (ref 3.5–5.3)
Sodium: 140 mEq/L (ref 135–145)
Total Bilirubin: 0.5 mg/dL (ref 0.3–1.2)
Total Protein: 7.1 g/dL (ref 6.0–8.3)

## 2011-05-28 LAB — CBC WITH DIFFERENTIAL/PLATELET
Basophils Absolute: 0 10*3/uL (ref 0.0–0.1)
Basophils Relative: 0 % (ref 0–1)
Eosinophils Absolute: 0 10*3/uL (ref 0.0–0.7)
Eosinophils Relative: 0 % (ref 0–5)
HCT: 38.1 % (ref 36.0–46.0)
Hemoglobin: 12.8 g/dL (ref 12.0–15.0)
Lymphocytes Relative: 31 % (ref 12–46)
Lymphs Abs: 1.4 10*3/uL (ref 0.7–4.0)
MCH: 29.3 pg (ref 26.0–34.0)
MCHC: 33.6 g/dL (ref 30.0–36.0)
MCV: 87.2 fL (ref 78.0–100.0)
Monocytes Absolute: 0.3 10*3/uL (ref 0.1–1.0)
Monocytes Relative: 8 % (ref 3–12)
Neutro Abs: 2.7 10*3/uL (ref 1.7–7.7)
Neutrophils Relative %: 61 % (ref 43–77)
Platelets: 278 10*3/uL (ref 150–400)
RBC: 4.37 MIL/uL (ref 3.87–5.11)
RDW: 13.2 % (ref 11.5–15.5)
WBC: 4.5 10*3/uL (ref 4.0–10.5)

## 2011-05-28 LAB — T-HELPER CELL (CD4) - (RCID CLINIC ONLY)
CD4 % Helper T Cell: 51 % (ref 33–55)
CD4 T Cell Abs: 760 uL (ref 400–2700)

## 2011-06-01 LAB — HIV-1 RNA QUANT-NO REFLEX-BLD
HIV 1 RNA Quant: <20 copies/mL (ref <20)
HIV-1 RNA Quant, Log: <1.3 {Log} (ref <1.30)

## 2011-07-01 ENCOUNTER — Telehealth: Payer: Self-pay | Admitting: *Deleted

## 2011-07-01 NOTE — Telephone Encounter (Signed)
I gave her a copy of her immunization record showing that she did get it

## 2011-07-07 ENCOUNTER — Telehealth: Payer: Self-pay | Admitting: *Deleted

## 2011-07-07 NOTE — Telephone Encounter (Signed)
I had given her a copy of her immunizations. She wanted to know if we had evidence of any other flu shots. Told her we did not. Suggested she call her previous md to see if he/she had some. Her company is penalizing her with a higher cost of insurance since she did not get it in time in 2011. She will call them  I also discussed the need for a pcp for non-infectious issues that arise & how having mds available 5 days a week would be a benefit. She is going to call [facility] at [facility] since she lives near there

## 2011-07-21 NOTE — Progress Notes (Signed)
Subjective:    Patient ID: Sara Mcintyre is a 51 y.o. female.  Chief Complaint: HIV Follow-up Visit Sara Mcintyre is here for follow-up of HIV infection. She is feeling better since her last visit.  She claims continued adherence to therapy with good tolerance and no complications. There are not additional complaints.    Data Review: Diagnostic studies reviewed.  Review of Systems - General ROS: negative for - fatigue, hot flashes or malaise Psychological ROS: negative for - anxiety, behavioral disorder, concentration difficulties, depression or mood swings Ophthalmic ROS: negative for - blurry vision, decreased vision or loss of vision ENT ROS: negative for - oral lesions or sore throat Endocrine ROS: negative Breast ROS: negative for breast lumps Respiratory ROS: no cough, shortness of breath, or wheezing Cardiovascular ROS: no chest pain or dyspnea on exertion Gastrointestinal ROS: no abdominal pain, change in bowel habits, or black or bloody stools Musculoskeletal ROS: negative Neurological ROS: no TIA or stroke symptoms Dermatological ROS: negative for rash and skin lesion changes  Objective:   General appearance: alert, cooperative and no distress Head: Normocephalic, without obvious abnormality, atraumatic Eyes: conjunctivae/corneas clear. PERRL, EOM's intact. Fundi benign. Ears: normal TM's and external ear canals both ears Throat: abnormal findings: dentition: poor Resp: clear to auscultation bilaterally Cardio: regular rate and rhythm, S1, S2 normal, no murmur, click, rub or gallop GI: soft, non-tender; bowel sounds normal; no masses,  no organomegaly Extremities: extremities normal, atraumatic, no cyanosis or edema Skin: Skin color, texture, turgor normal. No rashes or lesions Neurologic: Alert and oriented X 3, normal strength and tone. Normal symmetric reflexes. Normal coordination and gait Psych:  No vegetative signs or delusional behaviors noted.     Laboratory: From 01/15/2011 ,  CD4 count was 770 c/cmm @ 46 %. Viral load <20 copies/ml.     Assessment/Plan:   HIV INFECTION Clinically stable on current regimen. Continue present management.  Counseling provided on prevention of transmission of HIV. Condoms offered:  Yes Medication adherence discussed with patient.  Follow up visit in 4 months with labs 2 weeks prior to appointment. Patient verbally acknowledged information provided to them and agreed with plan of care.      Tyriana Helmkamp A. Sundra Aland, MS, [facility] [phone]  07/21/2011, 3:10 PM

## 2011-07-21 NOTE — Assessment & Plan Note (Signed)
Clinically stable on current regimen. Continue present management.  Counseling provided on prevention of transmission of HIV. Condoms offered:  Yes Medication adherence discussed with patient.  Follow up visit in 4 months with labs 2 weeks prior to appointment. Patient verbally acknowledged information provided to them and agreed with plan of care.

## 2011-07-24 ENCOUNTER — Telehealth: Payer: Self-pay | Admitting: *Deleted

## 2011-07-24 NOTE — Telephone Encounter (Signed)
Pt states she had a normal mammogram in October 2012. A few days ago she had one done on her job from a [facility] unit. They told her it was abnormal & she needed to be referred for a diagnostic mammogram. She is going to have them fax the report here. I told her we can get the order from a doctor & set up a follow up mammogram. I wwill call the pt back at [phone]

## 2011-07-27 NOTE — Telephone Encounter (Signed)
I lm telling her I have not rec'd the mammogram report yet. I asked her to have them fax it so I can set up a diagnostic asap

## 2011-07-29 ENCOUNTER — Encounter: Payer: Self-pay | Admitting: *Deleted

## 2011-07-29 NOTE — Telephone Encounter (Signed)
Phone # is not in service. Letter sent.

## 2011-08-17 ENCOUNTER — Other Ambulatory Visit: Payer: BC Managed Care – PPO

## 2011-08-27 ENCOUNTER — Encounter: Payer: Self-pay | Admitting: Internal Medicine

## 2011-08-27 ENCOUNTER — Ambulatory Visit (INDEPENDENT_AMBULATORY_CARE_PROVIDER_SITE_OTHER): Payer: BC Managed Care – PPO | Admitting: Internal Medicine

## 2011-08-27 VITALS — BP 136/74 | HR 82 | Temp 98.1°F | Wt 144.0 lb

## 2011-08-27 DIAGNOSIS — B20 Human immunodeficiency virus [HIV] disease: Secondary | ICD-10-CM

## 2011-08-27 DIAGNOSIS — Z21 Asymptomatic human immunodeficiency virus [HIV] infection status: Secondary | ICD-10-CM

## 2011-08-27 LAB — URINALYSIS
Bilirubin Urine: NEGATIVE
Glucose, UA: NEGATIVE mg/dL
Hgb urine dipstick: NEGATIVE
Ketones, ur: NEGATIVE mg/dL
Leukocytes, UA: NEGATIVE
Nitrite: NEGATIVE
Protein, ur: NEGATIVE mg/dL
Specific Gravity, Urine: 1.025 (ref 1.005–1.030)
Urobilinogen, UA: 0.2 mg/dL (ref 0.0–1.0)
pH: 7 (ref 5.0–8.0)

## 2011-08-27 LAB — CBC WITH DIFFERENTIAL/PLATELET
Basophils Absolute: 0 10*3/uL (ref 0.0–0.1)
Basophils Relative: 1 % (ref 0–1)
Eosinophils Absolute: 0 10*3/uL (ref 0.0–0.7)
Eosinophils Relative: 1 % (ref 0–5)
HCT: 38.1 % (ref 36.0–46.0)
Hemoglobin: 12.5 g/dL (ref 12.0–15.0)
Lymphocytes Relative: 34 % (ref 12–46)
Lymphs Abs: 1.1 10*3/uL (ref 0.7–4.0)
MCH: 28.4 pg (ref 26.0–34.0)
MCHC: 32.8 g/dL (ref 30.0–36.0)
MCV: 86.6 fL (ref 78.0–100.0)
Monocytes Absolute: 0.3 10*3/uL (ref 0.1–1.0)
Monocytes Relative: 9 % (ref 3–12)
Neutro Abs: 1.8 10*3/uL (ref 1.7–7.7)
Neutrophils Relative %: 56 % (ref 43–77)
Platelets: 264 10*3/uL (ref 150–400)
RBC: 4.4 MIL/uL (ref 3.87–5.11)
RDW: 12.9 % (ref 11.5–15.5)
WBC: 3.2 10*3/uL — ABNORMAL LOW (ref 4.0–10.5)

## 2011-08-27 LAB — COMPREHENSIVE METABOLIC PANEL
ALT: 19 U/L (ref 0–35)
AST: 22 U/L (ref 0–37)
Albumin: 4.7 g/dL (ref 3.5–5.2)
Alkaline Phosphatase: 60 U/L (ref 39–117)
BUN: 15 mg/dL (ref 6–23)
CO2: 27 mEq/L (ref 19–32)
Calcium: 9.4 mg/dL (ref 8.4–10.5)
Chloride: 106 mEq/L (ref 96–112)
Creat: 0.82 mg/dL (ref 0.50–1.10)
Glucose, Bld: 100 mg/dL — ABNORMAL HIGH (ref 70–99)
Potassium: 4.1 mEq/L (ref 3.5–5.3)
Sodium: 142 mEq/L (ref 135–145)
Total Bilirubin: 0.4 mg/dL (ref 0.3–1.2)
Total Protein: 7.5 g/dL (ref 6.0–8.3)

## 2011-08-27 LAB — LIPID PANEL
Cholesterol: 249 mg/dL — ABNORMAL HIGH (ref 0–200)
HDL: 82 mg/dL (ref >39)
LDL Cholesterol: 156 mg/dL — ABNORMAL HIGH (ref 0–99)
Total CHOL/HDL Ratio: 3 Ratio
Triglycerides: 54 mg/dL (ref <150)
VLDL: 11 mg/dL (ref 0–40)

## 2011-08-27 LAB — RPR

## 2011-08-27 NOTE — Progress Notes (Signed)
HIV CLINIC VISIT  RFV: routine visit/re-establishing care  Subjective:    Patient ID: Sara Mcintyre, female    DOB: 03/15/1961, 51 y.o.   MRN: [id number]  HPI Sara Mcintyre is 51yo AAF with HIV, CD 4 count of 760 (51%)/VL < 20 in 12/12 on RAL/truvada, however she abruptly stopped taking her ART thinking it was causing her to have bilateral feet pain. She states that she had difficulty getting through to clinic thus she waited until her regular visit appt. She states her feet pain has now spontaneously improved. She denies numbness or burning but thought it was sharp pain to bottom of her feet, which caused occasional cramping to her toes. Otherwise, no f/chills/nightsweats/cough/sore throat/diarrhea. She does suffer from constipation, but she thought it was due to her HIV meds, although she is also taking calcium supplements.   She has had history of abn. Pap, last done in Oct 2012, reportedly normal, but we have no records. She also states that she had to get repeat Mammo at [facility] since she initially had abnormal left mammo from [facility]. We will have to get copies of these records  Regarding her overall health, she takes mvi, calcium-vit d, and fish oil regularly. Regularly excercises. Today , she brings in a paper from work to promote health/fitness, to record weight and BP.  Active Ambulatory Problems    Diagnosis Date Noted  . HIV INFECTION 05/06/2010  . HYPERLIPIDEMIA 05/21/2010  . ASTHMA 05/21/2010  . GERD 05/21/2010  . PAP SMEAR, ABNORMAL, ASCUS 05/06/2010  . PNEUMONIA, HX OF 05/21/2010  . EXTERNAL HEMORRHOIDS WITHOUT MENTION COMP 06/05/2010   Resolved Ambulatory Problems    Diagnosis Date Noted  . No Resolved Ambulatory Problems   No Additional Past Medical History   Prior to Admission medications   Medication Sig Start Date End Date Taking? Authorizing Provider  emtricitabine-tenofovir (TRUVADA) 200-300 MG per tablet Take 1 tablet by mouth  daily. 01/26/11  Yes Carolin Guernsey, NP  fish oil-omega-3 fatty acids 1000 MG capsule Take 2 g by mouth daily.     Yes Historical Provider, MD  Multiple Vitamins-Minerals (CENTRUM ULTRA WOMENS) TABS Take by mouth daily.     Yes Historical Provider, MD  raltegravir (ISENTRESS) 400 MG tablet Take 1 tablet (400 mg total) by mouth 2 (two) times daily. 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 No smoking. She has rare alcohol- to occassional alcohol use, 1 bottle of wine may last 2 wks. She is currently in a discordant relationship with HIV - female since 10/2010. They have unprotected sex since her partner does not want to use condoms. She exercises regularly with walking, 2 sessions of 30-90minutes per week.  Family hx = heart disease, and diabetes  Review of Systems Constitutional: Negative for fever, chills, diaphoresis, activity change, appetite change, fatigue and unexpected weight change.  HENT: Negative for congestion, sore throat, rhinorrhea, sneezing, trouble swallowing and sinus pressure.  Eyes: Negative for photophobia and visual disturbance.  Respiratory: Negative for cough, chest tightness, shortness of breath, wheezing and stridor.  Cardiovascular: Negative for chest pain, palpitations and leg swelling.  Gastrointestinal: positive for constipation.Negative for nausea, vomiting, abdominal pain, diarrhea,  blood in stool, abdominal distention and anal bleeding.  Genitourinary: Negative for dysuria, hematuria, flank pain and difficulty urinating.  Musculoskeletal: Negative for myalgias, back pain, joint  swelling, arthralgias and gait problem.  Skin: Negative for color change, pallor, rash and wound.  Neurological: Negative for dizziness, tremors, weakness and light-headedness.  Hematological: Negative for adenopathy. Does not bruise/bleed easily.    Psychiatric/Behavioral: Negative for behavioral problems, confusion, sleep disturbance, dysphoric mood, decreased concentration and agitation.       Objective:   Physical Exam  BP 136/74  Pulse 82  Temp(Src) 98.1 F (36.7 C) (Oral)  Wt 144 lb (65.318 kg)   General Appearance:    Alert, cooperative, no distress, appears stated age  Head:    Normocephalic, without obvious abnormality, atraumatic  Eyes:    PERRL, conjunctiva/corneas clear, EOM's intact,   Ears:    Normal TM's and external ear canals, both ears  Nose:   Nares normal, septum midline, mucosa normal, no drainage    or sinus tenderness  Throat:   Lips, mucosa, and tongue normal; poor dentition. No OP erythema  Neck:   Supple, symmetrical, trachea midline, no adenopathy;        Lungs:     Clear to auscultation bilaterally, respirations unlabored      Heart:    Regular rate and rhythm, S1 and S2 normal, no murmur, rub   or gallop     Abdomen:     Soft, non-tender, bowel sounds active all four quadrants,    no masses, no organomegaly        Extremities:   Extremities normal, atraumatic, no cyanosis or edema  Pulses:   2+ and symmetric all extremities  Skin:   Skin color, texture, turgor normal, no rashes or lesions  Lymph nodes:   Cervical, supraclavicular, and axillary nodes normal  Neurologic:   CNII-XII intact, normal strength, sensation and reflexes    throughout       Assessment & Plan:  HIV = discussed with the patient that she should not stop her meds without consulting [place] first. That her feet pain was likely something else, unrelated, such as plantar fasciitis. She is instructed to restart truvada daily/ral BID. Call if recurrent feet pain  Health maintenance= will check lipids, ua, gc/chlam, rpr, hep b/c to see if she needs HBV vaccine series  Women's health = will get copies of pap and mammo to see if she needs re-schedule of her testing prior to oc 2013  hiv prevention= have provided condoms,  reiterate that she can transmit hiv to her partner, especially now since she is off her HIV meds.  rtc in 1 month  Will give dental referral

## 2011-08-28 LAB — GC/CHLAMYDIA PROBE AMP, URINE
Chlamydia, Swab/Urine, PCR: NEGATIVE
GC Probe Amp, Urine: NEGATIVE

## 2011-08-28 LAB — T-HELPER CELL (CD4) - (RCID CLINIC ONLY)
CD4 % Helper T Cell: 38 % (ref 33–55)
CD4 T Cell Abs: 410 uL (ref 400–2700)

## 2011-08-28 LAB — HEPATITIS B SURFACE ANTIBODY,QUALITATIVE: Hep B S Ab: NEGATIVE

## 2011-08-28 LAB — HEPATITIS B SURFACE ANTIGEN: Hepatitis B Surface Ag: POSITIVE — AB

## 2011-08-28 LAB — HEPATITIS C ANTIBODY: HCV Ab: NEGATIVE

## 2011-08-31 LAB — HIV-1 RNA QUANT-NO REFLEX-BLD
HIV 1 RNA Quant: 25518 copies/mL — ABNORMAL HIGH (ref <20)
HIV-1 RNA Quant, Log: 4.41 {Log} — ABNORMAL HIGH (ref <1.30)

## 2011-09-03 ENCOUNTER — Telehealth: Payer: Self-pay | Admitting: *Deleted

## 2011-09-03 NOTE — Telephone Encounter (Signed)
Mardene Celeste, RN called from the health dept. Doreene Adas, RN had asked for our OV notes & lab results related to her positive Hep. She asked Mr Pat Kocher to follow up & see if pt has been told she is positive. I told him I do not see documentation that she has been told. Since labs were done on the same day she was seen, she may not. Her MD is on vacation this week. He is going to contact the pt & tell her.

## 2011-09-10 ENCOUNTER — Telehealth: Payer: Self-pay | Admitting: *Deleted

## 2011-09-10 NOTE — Telephone Encounter (Signed)
Gareth Eagle, RN, [place], Communicable Disease, requesting Dr. Drue Second to discuss Hep B lab results with the pt.  Pt's next [facility] appt is 09/29/11.  Please fax OV notes from that appt to [facility].

## 2011-09-29 ENCOUNTER — Ambulatory Visit: Payer: BC Managed Care – PPO | Admitting: Internal Medicine

## 2011-10-06 ENCOUNTER — Telehealth: Payer: Self-pay | Admitting: *Deleted

## 2011-10-06 NOTE — Telephone Encounter (Signed)
Pt "no showed" for appt w/ Dr. Drue Second.  Dr. Drue Second was to have discussed the pt's Hep B lab results with the pt.  Mr. Sara Mcintyre informed that pt was not seen.  [facility] RN will attempt to contact the pt and reschedule the appt with Dr. Drue Second.    Phone call to the pt.  Message left to call [facility] to make a new return appt w/ Dr. Drue Second.  Address and phone number given.

## 2011-11-03 ENCOUNTER — Encounter: Payer: Self-pay | Admitting: Internal Medicine

## 2011-11-03 ENCOUNTER — Ambulatory Visit (INDEPENDENT_AMBULATORY_CARE_PROVIDER_SITE_OTHER): Payer: BC Managed Care – PPO | Admitting: Internal Medicine

## 2011-11-03 VITALS — BP 122/73 | HR 59 | Temp 98.1°F | Wt 145.0 lb

## 2011-11-03 DIAGNOSIS — B20 Human immunodeficiency virus [HIV] disease: Secondary | ICD-10-CM

## 2011-11-05 ENCOUNTER — Telehealth: Payer: Self-pay | Admitting: *Deleted

## 2011-11-05 ENCOUNTER — Other Ambulatory Visit: Payer: Self-pay | Admitting: *Deleted

## 2011-11-05 DIAGNOSIS — B20 Human immunodeficiency virus [HIV] disease: Secondary | ICD-10-CM

## 2011-11-05 MED ORDER — EMTRICITABINE-TENOFOVIR DF 200-300 MG PO TABS: 1.0000 | ORAL_TABLET | Freq: Every day | ORAL | Status: DC

## 2011-11-05 MED ORDER — RALTEGRAVIR POTASSIUM 400 MG PO TABS: 400.0000 mg | ORAL_TABLET | Freq: Two times a day (BID) | ORAL | Status: DC

## 2011-11-05 NOTE — Telephone Encounter (Signed)
Called the patient Rx insurance carrier ([employer]) to give them the information about the patient [employer] card information.    [employer] Marketing executive) information isVickie Epley) information is ZOX=[id number]     [id number] [id number]    MVH=[id number] PCN=[employer]     PCN=[employer] MW=[id number]    UV=[phone]

## 2011-11-06 ENCOUNTER — Other Ambulatory Visit: Payer: Self-pay | Admitting: *Deleted

## 2011-11-06 DIAGNOSIS — B20 Human immunodeficiency virus [HIV] disease: Secondary | ICD-10-CM

## 2011-11-06 MED ORDER — RALTEGRAVIR POTASSIUM 400 MG PO TABS: 400.0000 mg | ORAL_TABLET | Freq: Two times a day (BID) | ORAL | Status: DC

## 2011-11-06 NOTE — Telephone Encounter (Signed)
[employer] called to see if #30 isentress per month was correct. I changed it to #60 as it is BID. Gave this to pharmacist & re-sent electronically

## 2011-12-02 ENCOUNTER — Telehealth: Payer: Self-pay | Admitting: *Deleted

## 2011-12-02 NOTE — Telephone Encounter (Signed)
She had a form with her from [facility]. She needed to gather info & have it back to them by October 2013 to qualify for a lower weekly cost of insurance. She has a pap scheduled here and I offered to print labs,immunizations, etc. She did not want that. Wanted a breast exam & labs. I suggested she find a pcp for her healthcare other than her HIV. She agreed. I gave her names of mds in this building & suggested she see if they are taking new pts.  She is due back here in September for labs & md visit.

## 2011-12-11 ENCOUNTER — Encounter: Payer: Self-pay | Admitting: *Deleted

## 2011-12-11 ENCOUNTER — Ambulatory Visit: Payer: BC Managed Care – PPO

## 2011-12-31 ENCOUNTER — Telehealth: Payer: Self-pay | Admitting: *Deleted

## 2011-12-31 NOTE — Telephone Encounter (Signed)
Call to [facility] to find out if pt is receiving Primary Care there and PAP smears.  The receptionist there checked their records and the pt has not been there since she was referred to [facility] in 2011.  They have attempted multiple contacts and have had no response.  Patient does not have a current PAP smear report.  Pt does have a return appt w/ Dr. Drue Second in September.  Will note the need for PAP smear in the future appt.

## 2012-02-01 ENCOUNTER — Other Ambulatory Visit: Payer: Self-pay | Admitting: *Deleted

## 2012-02-01 DIAGNOSIS — B20 Human immunodeficiency virus [HIV] disease: Secondary | ICD-10-CM

## 2012-02-01 MED ORDER — EMTRICITABINE-TENOFOVIR DF 200-300 MG PO TABS: 1.0000 | ORAL_TABLET | Freq: Every day | ORAL | Status: DC

## 2012-02-01 MED ORDER — RALTEGRAVIR POTASSIUM 400 MG PO TABS: 400.0000 mg | ORAL_TABLET | Freq: Two times a day (BID) | ORAL | Status: DC

## 2012-02-22 ENCOUNTER — Other Ambulatory Visit: Payer: Self-pay | Admitting: Internal Medicine

## 2012-02-22 DIAGNOSIS — B20 Human immunodeficiency virus [HIV] disease: Secondary | ICD-10-CM

## 2012-02-24 ENCOUNTER — Telehealth: Payer: Self-pay | Admitting: *Deleted

## 2012-02-24 ENCOUNTER — Other Ambulatory Visit: Payer: BC Managed Care – PPO

## 2012-02-24 ENCOUNTER — Encounter: Payer: Self-pay | Admitting: *Deleted

## 2012-02-24 DIAGNOSIS — B20 Human immunodeficiency virus [HIV] disease: Secondary | ICD-10-CM

## 2012-02-24 LAB — COMPREHENSIVE METABOLIC PANEL
Albumin: 4.5 g/dL (ref 3.5–5.2)
BUN: 18 mg/dL (ref 6–23)
CO2: 25 mEq/L (ref 19–32)
Calcium: 9.3 mg/dL (ref 8.4–10.5)
Chloride: 106 mEq/L (ref 96–112)
Glucose, Bld: 88 mg/dL (ref 70–99)
Potassium: 3.9 mEq/L (ref 3.5–5.3)
Total Protein: 7.3 g/dL (ref 6.0–8.3)

## 2012-02-24 LAB — CBC WITH DIFFERENTIAL/PLATELET
HCT: 39.3 % (ref 36.0–46.0)
Hemoglobin: 13.1 g/dL (ref 12.0–15.0)
Lymphocytes Relative: 40 % (ref 12–46)
Lymphs Abs: 1.2 10*3/uL (ref 0.7–4.0)
MCHC: 33.3 g/dL (ref 30.0–36.0)
Monocytes Absolute: 0.3 10*3/uL (ref 0.1–1.0)
Monocytes Relative: 9 % (ref 3–12)
Neutro Abs: 1.5 10*3/uL — ABNORMAL LOW (ref 1.7–7.7)
RBC: 4.6 MIL/uL (ref 3.87–5.11)
WBC: 3.1 10*3/uL — ABNORMAL LOW (ref 4.0–10.5)

## 2012-02-24 NOTE — Telephone Encounter (Signed)
Patient called and advised she has not received her shipment of Truvada for this month. She has her Isentress and is aware not to take it until she gets her truvada. She asked for help trying to get through to the insurance company. She advised she has called several times and can not get any answers. Advised her will call and check on this for her.  Called [employer] and was advised that the patients account is now serviced by [facility] and given a number to call [phone]. Called this number and was advised that the patient needs a prior auth for these medications. Asked the operator if they sent any notice of prior auth to [place] because we have not received anything. She advised no just D/C the Rx and notice sent to the patient. Then transferred to the prior auth department [phone]. Once there advised that the Rx were D/C due to balance on the acct. Advised they will reinstate the Rx but the patient will have to call to have shipment set up. Asked if they will take copay assistance cards and was told yes but to give the patient [phone] to set things up and give them the information.  Called the patient and had to leave a message for her to call us back. Will advise her of the number and give her copay cards to activate prior to calling time back. [phone]

## 2012-02-25 LAB — HIV-1 RNA QUANT-NO REFLEX-BLD: HIV-1 RNA Quant, Log: <1.3 {Log} (ref <1.30)

## 2012-02-25 LAB — T-HELPER CELL (CD4) - (RCID CLINIC ONLY): CD4 % Helper T Cell: 41 % (ref 33–55)

## 2012-03-09 ENCOUNTER — Ambulatory Visit: Payer: BC Managed Care – PPO | Admitting: Internal Medicine

## 2012-03-11 ENCOUNTER — Ambulatory Visit (INDEPENDENT_AMBULATORY_CARE_PROVIDER_SITE_OTHER): Payer: BC Managed Care – PPO | Admitting: *Deleted

## 2012-03-11 ENCOUNTER — Ambulatory Visit: Payer: BC Managed Care – PPO

## 2012-03-11 DIAGNOSIS — Z124 Encounter for screening for malignant neoplasm of cervix: Secondary | ICD-10-CM

## 2012-03-11 NOTE — Patient Instructions (Addendum)
Your results will be ready in about a week.  I will mail them to you.  Thank you for coming to the Center for your care.  Patrece Tallie,  RN 

## 2012-03-11 NOTE — Progress Notes (Signed)
  Subjective:     Sara Mcintyre is a 51 y.o. woman who comes in today for a  pap smear only.  Contraception:condoms.  Pt stated that she had "laser" surgery for "fibroids" 5 years ago.  Objective:    There were no vitals taken for this visit. Pelvic Exam:  Pap smear obtained.   Assessment:    Screening pap smear.   Plan:    Follow up in one year, or as indicated by Pap results. Pt given condoms. Pt given educational materials re: HIV and women, heart disease, nutrition, diet, exercise, PAP smears, self-esteem, and partner protection. BSE card given.

## 2012-03-16 ENCOUNTER — Encounter: Payer: Self-pay | Admitting: *Deleted

## 2012-03-21 NOTE — Progress Notes (Signed)
HIV CLINIC NOTE   RFV: routine followup Subjective:    Patient ID: Sara Mcintyre, female    DOB: January 17, 1961, 51 y.o.   MRN: [id number]  HPI Sara Mcintyre isa 51 yo F with HIV, CD 4 count of 410 (38%)/VL 25,518 ( in dec cd 4 count of 716/ LV ,20), previously on raltegravir and truvada.She stopped her ART and ran out of medications last month.  No fever, chills, nightsweats, rash, diarrhea, nausea, headache, arthralgias. Current Outpatient Prescriptions on File Prior to Visit  Medication Sig Dispense Refill  . fish oil-omega-3 fatty acids 1000 MG capsule Take 2 g by mouth daily.        . Multiple Vitamins-Minerals (CENTRUM ULTRA WOMENS) TABS Take by mouth daily.        Marland Kitchen emtricitabine-tenofovir (TRUVADA) 200-300 MG per tablet Take 1 tablet by mouth daily.  90 tablet  4  . raltegravir (ISENTRESS) 400 MG tablet Take 1 tablet (400 mg total) by mouth 2 (two) times daily.  180 tablet  4   Active Ambulatory Problems    Diagnosis Date Noted  . HIV INFECTION 05/06/2010  . HYPERLIPIDEMIA 05/21/2010  . ASTHMA 05/21/2010  . GERD 05/21/2010  . PAP SMEAR, ABNORMAL, ASCUS 05/06/2010  . PNEUMONIA, HX OF 05/21/2010  . EXTERNAL HEMORRHOIDS WITHOUT MENTION COMP 06/05/2010   Resolved Ambulatory Problems    Diagnosis Date Noted  . No Resolved Ambulatory Problems   No Additional Past Medical History   Soc= History  Substance Use Topics  . Smoking status: Never Smoker   . Smokeless tobacco: Never Used  . 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  appears well-developed and well-nourished. No distress.  HENT:  Mouth/Throat: Oropharynx is clear and moist. No oropharyngeal exudate.  Cardiovascular: Normal  rate, regular rhythm and normal heart sounds. Exam reveals no gallop and no friction rub.  No murmur heard.  Pulmonary/Chest: Effort normal and breath sounds normal. No respiratory distress.  no wheezes.  Abdominal: Soft. Bowel sounds are normal.  exhibits no distension. There is no tenderness.  Lymphadenopathy:  no cervical adenopathy.  Skin: Skin is warm and dry. No rash noted. No erythema.  Psychiatric:  normal mood and affect. His behavior is normal.      Assessment & Plan:  HIV= will need to establish her with adap or also see how we can get her back onto truvada and raltegravir. Will check cd 4 count and VL today  Chronic hep B= will check HBVL, making sure she does not have flare since stopping meds  Health maintenance =will check PPD at this visit  rtc in 4-6 wks to see how she is doing back on regimen.   Will refer to [facility] to help with adherence.  rtc in 4-6 wk

## 2012-03-22 ENCOUNTER — Ambulatory Visit (INDEPENDENT_AMBULATORY_CARE_PROVIDER_SITE_OTHER): Payer: BC Managed Care – PPO | Admitting: Internal Medicine

## 2012-03-22 ENCOUNTER — Encounter: Payer: Self-pay | Admitting: Internal Medicine

## 2012-03-22 VITALS — BP 133/82 | HR 61 | Temp 98.0°F | Wt 142.0 lb

## 2012-03-22 DIAGNOSIS — B181 Chronic viral hepatitis B without delta-agent: Secondary | ICD-10-CM

## 2012-03-22 DIAGNOSIS — Z23 Encounter for immunization: Secondary | ICD-10-CM

## 2012-03-22 NOTE — Progress Notes (Signed)
  Subjective:    Patient ID: Sara Mcintyre, female    DOB: 10-May-1961, 51 y.o.   MRN: [id number]  HPI 51 yo Female, with HIV, CD 4 count of 480, VL < 20. On truvada, and raltegravir. Missing   Current Outpatient Prescriptions on File Prior to Visit  Medication Sig Dispense Refill  . emtricitabine-tenofovir (TRUVADA) 200-300 MG per tablet Take 1 tablet by mouth daily.  90 tablet  4  . fish oil-omega-3 fatty acids 1000 MG capsule Take 2 g by mouth daily.        . Multiple Vitamins-Minerals (CENTRUM ULTRA WOMENS) TABS Take by mouth daily.        . raltegravir (ISENTRESS) 400 MG tablet Take 1 tablet (400 mg total) by mouth 2 (two) times daily.  180 tablet  4   Active Ambulatory Problems    Diagnosis Date Noted  . HIV INFECTION 05/06/2010  . HYPERLIPIDEMIA 05/21/2010  . ASTHMA 05/21/2010  . GERD 05/21/2010  . PAP SMEAR, ABNORMAL, ASCUS 05/06/2010  . PNEUMONIA, HX OF 05/21/2010  . EXTERNAL HEMORRHOIDS WITHOUT MENTION COMP 06/05/2010   Resolved Ambulatory Problems    Diagnosis Date Noted  . No Resolved Ambulatory Problems   No Additional Past Medical History       social hx = a bottle of wine lasts  Review of Systems     Objective:   Physical Exam BP 133/82  Pulse 61  Temp 98 F (36.7 C) (Oral)  Wt 142 lb (64.411 kg) Physical Exam  Constitutional:  oriented to person, place, and time.  appears well-developed and well-nourished. No distress.  HENT: poor dentition Mouth/Throat: Oropharynx is clear and moist. No oropharyngeal exudate.  Cardiovascular: Normal rate, regular rhythm and normal heart sounds. Exam reveals no gallop and no friction rub. No murmur heard.  Pulmonary/Chest: Effort normal and breath sounds normal. No respiratory distress. He has no wheezes.  Abdominal: Soft. Bowel sounds are normal.  no distension. There is no tenderness.  Lymphadenopathy:  no cervical adenopathy.   Skin: Skin is warm and dry. No rash noted. No erythema.       Assessment & Plan:    HIV = contiue on raltegravir and truvada; encourage better adherence  Hyperlipidemia = continue with fish oil  Health maintenance = getting flu shot today.  Dental referral   Hep b serology =since recent hep b s ag +, will check Heb B c Ab, Hep B s Ab, HBV VL  rtc in 3 months

## 2012-03-23 LAB — HEPATITIS B SURFACE ANTIBODY,QUALITATIVE: Hep B S Ab: POSITIVE — AB

## 2012-03-23 LAB — HEPATITIS B CORE ANTIBODY, TOTAL: Hep B Core Total Ab: POSITIVE — AB

## 2012-03-24 LAB — HEPATITIS B DNA, ULTRAQUANTITATIVE, PCR: Hepatitis B DNA: NOT DETECTED IU/mL (ref <20)

## 2012-04-05 ENCOUNTER — Ambulatory Visit: Payer: Self-pay

## 2012-11-11 ENCOUNTER — Encounter (HOSPITAL_COMMUNITY): Payer: Self-pay | Admitting: *Deleted

## 2012-11-11 ENCOUNTER — Emergency Department (HOSPITAL_COMMUNITY)
Admission: EM | Admit: 2012-11-11 | Discharge: 2012-11-11 | Disposition: A | Discharge status: Home / Self Care | Payer: No Typology Code available for payment source | Attending: Emergency Medicine | Admitting: Emergency Medicine

## 2012-11-11 DIAGNOSIS — S4980XA Other specified injuries of shoulder and upper arm, unspecified arm, initial encounter: Secondary | ICD-10-CM | POA: Insufficient documentation

## 2012-11-11 DIAGNOSIS — S79929A Unspecified injury of unspecified thigh, initial encounter: Secondary | ICD-10-CM | POA: Insufficient documentation

## 2012-11-11 DIAGNOSIS — S46909A Unspecified injury of unspecified muscle, fascia and tendon at shoulder and upper arm level, unspecified arm, initial encounter: Secondary | ICD-10-CM | POA: Insufficient documentation

## 2012-11-11 DIAGNOSIS — Y92410 Unspecified street and highway as the place of occurrence of the external cause: Secondary | ICD-10-CM | POA: Insufficient documentation

## 2012-11-11 DIAGNOSIS — M791 Myalgia, unspecified site: Secondary | ICD-10-CM

## 2012-11-11 DIAGNOSIS — S79919A Unspecified injury of unspecified hip, initial encounter: Secondary | ICD-10-CM | POA: Insufficient documentation

## 2012-11-11 DIAGNOSIS — Y9389 Activity, other specified: Secondary | ICD-10-CM | POA: Insufficient documentation

## 2012-11-11 MED ORDER — HYDROCODONE-ACETAMINOPHEN 5-325 MG PO TABS: 1.0000 | ORAL_TABLET | ORAL | Status: DC | PRN

## 2012-11-11 MED ORDER — CYCLOBENZAPRINE HCL 10 MG PO TABS: 10.0000 mg | ORAL_TABLET | Freq: Two times a day (BID) | ORAL | Status: DC | PRN
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
## 2012-11-12 NOTE — ED Provider Notes (Signed)
Medical screening examination/treatment/procedure(s) were performed by non-physician practitioner and as supervising physician I was immediately available for consultation/collaboration.  Shamika Pedregon, MD 11/12/12 0031 

## 2012-11-15 ENCOUNTER — Encounter (HOSPITAL_COMMUNITY): Payer: Self-pay | Admitting: Emergency Medicine

## 2012-11-15 ENCOUNTER — Emergency Department (HOSPITAL_COMMUNITY)
Admission: EM | Admit: 2012-11-15 | Discharge: 2012-11-15 | Disposition: A | Discharge status: Home / Self Care | Payer: No Typology Code available for payment source | Attending: Emergency Medicine | Admitting: Emergency Medicine

## 2012-11-15 DIAGNOSIS — Z8619 Personal history of other infectious and parasitic diseases: Secondary | ICD-10-CM | POA: Insufficient documentation

## 2012-11-15 DIAGNOSIS — G8911 Acute pain due to trauma: Secondary | ICD-10-CM | POA: Insufficient documentation

## 2012-11-15 DIAGNOSIS — Z21 Asymptomatic human immunodeficiency virus [HIV] infection status: Secondary | ICD-10-CM | POA: Insufficient documentation

## 2012-11-15 DIAGNOSIS — M549 Dorsalgia, unspecified: Secondary | ICD-10-CM | POA: Insufficient documentation

## 2012-11-15 DIAGNOSIS — Z79899 Other long term (current) drug therapy: Secondary | ICD-10-CM | POA: Insufficient documentation

## 2012-11-15 MED ORDER — OXYCODONE-ACETAMINOPHEN 5-325 MG PO TABS
2.0000 | ORAL_TABLET | Freq: Once | ORAL | Status: AC
Start: 2012-11-15 — End: 2012-11-15
  Administered 2012-11-15: 2 via ORAL
  Filled 2012-11-15: qty 2

## 2012-11-15 MED ORDER — MELOXICAM 15 MG PO TABS: 15.0000 mg | ORAL_TABLET | Freq: Every day | ORAL | Status: DC

## 2012-11-15 MED ORDER — OXYCODONE-ACETAMINOPHEN 5-325 MG PO TABS: 2.0000 | ORAL_TABLET | Freq: Once | ORAL | Status: DC

## 2012-11-15 MED ORDER — DEXAMETHASONE SODIUM PHOSPHATE 10 MG/ML IJ SOLN
10.0000 mg | Freq: Once | INTRAMUSCULAR | Status: AC
  Administered 2012-11-15: 10 mg via INTRAMUSCULAR
  Filled 2012-11-15: qty 1

## 2012-11-15 MED ORDER — DEXAMETHASONE SODIUM PHOSPHATE 10 MG/ML IJ SOLN: 10.0000 mg | Freq: Once | INTRAMUSCULAR | Status: DC

## 2012-11-15 MED ORDER — OXYCODONE-ACETAMINOPHEN 5-325 MG PO TABS: 2.0000 | ORAL_TABLET | ORAL | Status: DC | PRN
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
## 2012-11-16 NOTE — ED Provider Notes (Signed)
Medical screening examination/treatment/procedure(s) were performed by non-physician practitioner and as supervising physician I was immediately available for consultation/collaboration.  Lalisa Kiehn L Nevin Grizzle, MD 11/16/12 1748 

## 2013-01-13 ENCOUNTER — Emergency Department: Payer: Self-pay | Admitting: Emergency Medicine

## 2013-01-25 ENCOUNTER — Telehealth: Payer: Self-pay

## 2013-01-25 NOTE — Telephone Encounter (Signed)
Patient called and left message to apply for ADAP/RW - her phone # cut-off so, left message for her to call at daughter's phone- appears she may have lost her insurance

## 2013-02-08 ENCOUNTER — Ambulatory Visit: Payer: BC Managed Care – PPO

## 2013-03-20 ENCOUNTER — Ambulatory Visit: Payer: Self-pay | Admitting: Orthopedic Surgery

## 2013-07-28 ENCOUNTER — Telehealth: Payer: Self-pay | Admitting: *Deleted

## 2013-07-28 NOTE — Telephone Encounter (Signed)
Received fax from Laurette SchimkeJulie Willis @ [facility].  Pt is returning to care, [facility] needs proof of positivity to engage in care.  Faxed last office note and lab results. Andree CossHowell, Draven Natter M, RN

## 2014-02-15 ENCOUNTER — Ambulatory Visit: Payer: Self-pay | Admitting: Obstetrics and Gynecology

## 2014-02-15 LAB — HEMOGLOBIN: HGB: 11.6 g/dL — ABNORMAL LOW (ref 12.0–16.0)

## 2014-02-16 ENCOUNTER — Ambulatory Visit: Payer: Self-pay | Admitting: Obstetrics and Gynecology

## 2014-02-16 ENCOUNTER — Ambulatory Visit: Payer: Self-pay | Admitting: Family Medicine

## 2014-02-16 HISTORY — PX: HYSTEROSCOPY WITH D & C: SHX1775

## 2014-02-23 LAB — PATHOLOGY REPORT

## 2014-10-06 NOTE — Op Note (Signed)
PATIENT NAME:  Sara Mcintyre, Sara Mcintyre MR#:  [id number] DATE OF BIRTH:  [birth year]  DATE OF PROCEDURE:  02/16/2014  PREOPERATIVE DIAGNOSES:  1.  Unsatisfactory colposcopy in office secondary to inadequate visualization of the squamocolumnar juncture.  2.  Stenotic cervical os.  3.  Abnormal uterine bleeding.  4.  Cervical lesions at 6 o'clock. 5.  Fibroid uterus.   POSTOPERATIVE DIAGNOSES:  1.  Unsatisfactory colposcopy in office secondary to inadequate visualization of the squamocolumnar juncture.  2.  Stenotic cervical os.  3.  Abnormal uterine bleeding.  4.  Cervical lesions at 6 o'clock. 5.  Fibroid uterus.   ANESTHESIA: General.   SURGEON: Hildred LaserAnika Antoino Westhoff, MD  FLUIDS: 1000 mL of lactated Ringer's.   URINE OUTPUT: 100 mL.   ESTIMATED BLOOD LOSS: 25 mL.   PROCEDURE: The patient was taken to the Operating Room and placed under general anesthesia without difficulty.  She was then prepped and draped in the normal sterile fashion and placed in the dorsal lithotomy position.  Timeout was performed verifying the above noted procedure and exam under anesthesia was performed with findings of small uterine size approximately 6 week size uterus.  A straight catheterization was performed with return of 100 mL of clear urine.  Next, an insulated bivalve speculum was placed into the patient's vagina and the cervix was identified.  Cervix was then grasped with a single-tooth tenaculum and the uterus was attempted to be sounded; however, the sound could not pass through the cervical canal.  Attempts were made to dilate the cervix using lacrimal duct dilators however, no dilator was able to pass through.  The single-tooth tenaculum was removed and an ectocervical LEEP was performed in order to remove the cervical nodule that was located at the 6 o'clock position and then an endocervical LEEP was performed in order to bypass the stenotic area of the cervix.  After this, the cervix was able to be dilated  appropriately and an endocervical curettage was performed.  The single-tooth tenaculum was then used once again to grasp the anterior lip of the cervix.  A 5 mm hysteroscope was introduced into the uterus under direct visualization.  The cavity was allowed to fill and the entire cavity was explored with the findings described above.  The scope was removed and the cavity was gently sounded to 6 cm and a sharp curette was then passed into the uterus and endometrial sampling was collected for pathology.  The tenaculum was removed and excellent hemostasis was noted. The Bovie was then used to cauterize any noted areas of bleeding where the LEEP procedure was performed.  Good hemostasis was again noted.  Monsels was then placed over the removed area.  Instrument and sponge counts were correct x 2 at the end of the procedure. The patient was awakened and taken to the PACU in stable condition.       ____________________________ Jacques EarthlyAnika S. Valentino Saxonherry, MD asc:DT D: 02/16/2014 15:56:13 ET T: 02/16/2014 16:41:58 ET JOB#: [id number]  cc: Jacques EarthlyAnika S. Valentino Saxonherry, MD, <Dictator> Fabian NovemberANIKA S Avelardo Reesman MD ELECTRONICALLY SIGNED 03/12/2014 11:55

## 2014-12-13 ENCOUNTER — Encounter: Payer: Self-pay | Admitting: Emergency Medicine

## 2014-12-13 ENCOUNTER — Emergency Department
Admission: EM | Admit: 2014-12-13 | Discharge: 2014-12-13 | Disposition: A | Discharge status: Home / Self Care | Payer: Medicaid Other | Attending: Emergency Medicine | Admitting: Emergency Medicine

## 2014-12-13 ENCOUNTER — Emergency Department: Payer: Medicaid Other

## 2014-12-13 DIAGNOSIS — M79604 Pain in right leg: Secondary | ICD-10-CM | POA: Diagnosis present

## 2014-12-13 DIAGNOSIS — Z79899 Other long term (current) drug therapy: Secondary | ICD-10-CM | POA: Diagnosis not present

## 2014-12-13 DIAGNOSIS — M79605 Pain in left leg: Secondary | ICD-10-CM | POA: Insufficient documentation

## 2014-12-13 DIAGNOSIS — R609 Edema, unspecified: Secondary | ICD-10-CM | POA: Insufficient documentation

## 2014-12-13 DIAGNOSIS — R110 Nausea: Secondary | ICD-10-CM | POA: Insufficient documentation

## 2014-12-13 DIAGNOSIS — Z792 Long term (current) use of antibiotics: Secondary | ICD-10-CM | POA: Insufficient documentation

## 2014-12-13 NOTE — ED Notes (Signed)
Pt reports that both of her legs are swollen. States that she has had some SOB. She reports that her leg are sore also.

## 2014-12-13 NOTE — ED Notes (Signed)
Pt alert and oriented X4, active, cooperative, pt in NAD. RR even and unlabored, color WNL.  Pt informed to return if any life threatening symptoms occur.   

## 2014-12-13 NOTE — Discharge Instructions (Signed)
No certain cause was found for your leg pain and swelling although the swelling is minimal today. It sounds that it may be either related to joint problems, or musculoskeletal pain. Return to emergency department for any new or worsening pain, swelling, trouble breathing, shortness of breath, chest pain, fever, skin rash, joint pain, or any other symptoms concerning to you.

## 2014-12-13 NOTE — ED Notes (Addendum)
Bilateral lower leg pain and swelling, noticed swelling last night. No edema at this time. Ambulatory. Cap refill WNL.  Also c/o lower back pain, chronic back pain

## 2014-12-13 NOTE — ED Provider Notes (Signed)
[facility] Emergency Department Provider Note   ____________________________________________  Time seen: 9:40 AM I have reviewed the triage vital signs and the triage nursing note.  HISTORY  Chief Complaint Leg Swelling   Historian Patient  HPI Sara Mcintyre is a 54 y.o. female who came to the emergency department for evaluation of bilateral lower leg pain and swelling. She states his knees seemed painful and swollen last night and it extended down her legs. She is complaining of calf pain bilaterally right now. She has history of chronic low back pain, and states that her back pain is at its typical chronic level, and no worse. She's had no incontinence of urine or stool. She states that she often has episodes of shortness of breath, but it is mild, not associated with specific chest pain, sweating, dizziness, passing out. No recent illness. History of chronic headaches in the past, with recurrence over the past 5 months. No headache now.    History reviewed. No pertinent past medical history.  Patient Active Problem List   Diagnosis Date Noted  . EXTERNAL HEMORRHOIDS WITHOUT MENTION COMP 06/05/2010  . HYPERLIPIDEMIA 05/21/2010  . ASTHMA 05/21/2010  . GERD 05/21/2010  . PNEUMONIA, HX OF 05/21/2010  . HIV INFECTION 05/06/2010  . PAP SMEAR, ABNORMAL, ASCUS 05/06/2010    Past Surgical History  Procedure Laterality Date  . Abdominal hysterectomy      Current Outpatient Rx  Name  Route  Sig  Dispense  Refill  . cyclobenzaprine (FLEXERIL) 10 MG tablet   Oral   Take 1 tablet (10 mg total) by mouth 2 (two) times daily as needed for muscle spasms.   20 tablet   0   . fish oil-omega-3 fatty acids 1000 MG capsule   Oral   Take 1 g by mouth daily.          Marland Kitchen HYDROcodone-acetaminophen (NORCO/VICODIN) 5-325 MG per tablet   Oral   Take 1 tablet by mouth every 4 (four) hours as needed for pain.         Marland Kitchen ibuprofen (ADVIL,MOTRIN) 200 MG  tablet   Oral   Take 400 mg by mouth every 6 (six) hours as needed for pain.         . meloxicam (MOBIC) 15 MG tablet   Oral   Take 1 tablet (15 mg total) by mouth daily. Take 1 daily with food.   10 tablet   0   . Multiple Vitamin (MULTIVITAMIN WITH MINERALS) TABS   Oral   Take 1 tablet by mouth daily.         Marland Kitchen oxyCODONE-acetaminophen (PERCOCET) 5-325 MG per tablet   Oral   Take 2 tablets by mouth every 4 (four) hours as needed for pain.   20 tablet   0     Allergies Review of patient's allergies indicates no known allergies.  History reviewed. No pertinent family history.  Social History History  Substance Use Topics  . Smoking status: Never Smoker   . Smokeless tobacco: Never Used  . Alcohol Use: 1.0 oz/week    2 Standard drinks or equivalent per week    Review of Systems  Constitutional: Negative for fever. Eyes: Negative for visual changes. ENT: Negative for sore throat. Cardiovascular: Negative for chest pain. Respiratory: Negative for cough Gastrointestinal: Chronic nausea for which takes omeprazole. Genitourinary: Negative for dysuria. Musculoskeletal: See above. Skin: Negative for rash. Neurological: Negative for focal weakness or numbness. 10 point Review of Systems otherwise negative ____________________________________________  PHYSICAL EXAM:  VITAL SIGNS: ED Triage Vitals  Enc Vitals Group     BP 12/13/14 0859 132/82 mmHg     Pulse Rate 12/13/14 0859 72     Resp --      Temp 12/13/14 0859 98.5 F (36.9 C)     Temp Source 12/13/14 0859 Oral     SpO2 12/13/14 0859 99 %     Weight 12/13/14 0859 163 lb (73.936 kg)     Height 12/13/14 0859 5\' 2"  (1.575 m)     Head Cir --      Peak Flow --      Pain Score 12/13/14 0853 8     Pain Loc --      Pain Edu? --      Excl. in GC? --      Constitutional: Alert and oriented. Well appearing and in no distress. Eyes: Conjunctivae are normal. PERRL. Normal extraocular movements. ENT    Head: Normocephalic and atraumatic.   Nose: No congestion/rhinnorhea.   Mouth/Throat: Mucous membranes are moist.   Neck: No stridor. Cardiovascular/Chest: Normal rate, regular rhythm.  No murmurs, rubs, or gallops. Respiratory: Normal respiratory effort without tachypnea nor retractions. Breath sounds are clear and equal bilaterally. No wheezes/rales/rhonchi. Gastrointestinal: Soft. No distention, no guarding, no rebound. Nontender  Genitourinary/rectal:Deferred Musculoskeletal: Normal range of motion in all extremities. No joint effusions.  Trace lower extremity edema bilaterally. Tenderness in the calves bilaterally. Neurologic:  Normal speech and language. No gross focal neurologic deficits are appreciated. Skin:  Skin is warm, dry and intact. No rash noted. Psychiatric: Mood and affect are normal. Speech and behavior are normal. Patient exhibits appropriate insight and judgment.  ____________________________________________   EKG I, Governor Rooksebecca Annsley Akkerman, MD, the attending physician have personally viewed and interpreted all ECGs.  None ____________________________________________  LABS (pertinent positives/negatives)  None  ____________________________________________  RADIOLOGY All Xrays were viewed by me. Imaging interpreted by Radiologist.  Ultrasound lower extremity bilaterally: Negative __________________________________________  PROCEDURES  Procedure(s) performed: None Critical Care performed: None  ____________________________________________   ED COURSE / ASSESSMENT AND PLAN  CONSULTATIONS: None  Pertinent labs & imaging results that were available during my care of the patient were reviewed by me and considered in my medical decision making (see chart for details).  Patient is overall well-appearing. Her main complaint seems to be this lower extremity pain and swelling which was worse yesterday. No significant risk factors for DVT, however as this is  the most emergent condition, will check lower extremity Doppler ultrasound. No symptoms to suggest acute joint problem, or congestive heart failure. The patient does have several chronic issues including headaches, nausea, back pain, but none of these are concerning for a new or emergent condition per patient. She is to follow with PCP.   Patient / Family / Caregiver informed of clinical course, medical decision-making process, and agree with plan.   I discussed return precautions, follow-up instructions, and discharged instructions with patient and/or family.  ___________________________________________   FINAL CLINICAL IMPRESSION(S) / ED DIAGNOSES   Final diagnoses:  Bilateral lower extremity pain    FOLLOW UP  Referred to: Primary care physician   Governor Rooksebecca Kamiya Acord, MD 12/13/14 [phone]

## 2014-12-13 NOTE — ED Notes (Signed)
MD at bedside. 

## 2015-01-30 ENCOUNTER — Other Ambulatory Visit: Payer: Self-pay | Admitting: Family Medicine

## 2015-01-30 DIAGNOSIS — M545 Low back pain: Secondary | ICD-10-CM

## 2015-06-16 DIAGNOSIS — M545 Low back pain, unspecified: Secondary | ICD-10-CM

## 2015-06-16 DIAGNOSIS — G8929 Other chronic pain: Secondary | ICD-10-CM

## 2015-06-16 HISTORY — DX: Other chronic pain: G89.29

## 2015-06-16 HISTORY — DX: Low back pain, unspecified: M54.50

## 2015-10-19 ENCOUNTER — Encounter: Payer: Self-pay | Admitting: Emergency Medicine

## 2015-10-19 ENCOUNTER — Emergency Department
Admission: EM | Admit: 2015-10-19 | Discharge: 2015-10-19 | Disposition: A | Discharge status: Home / Self Care | Payer: Medicaid Other | Attending: Student | Admitting: Student

## 2015-10-19 DIAGNOSIS — Z21 Asymptomatic human immunodeficiency virus [HIV] infection status: Secondary | ICD-10-CM | POA: Diagnosis not present

## 2015-10-19 DIAGNOSIS — Z79899 Other long term (current) drug therapy: Secondary | ICD-10-CM | POA: Diagnosis not present

## 2015-10-19 DIAGNOSIS — J0100 Acute maxillary sinusitis, unspecified: Secondary | ICD-10-CM | POA: Insufficient documentation

## 2015-10-19 DIAGNOSIS — J301 Allergic rhinitis due to pollen: Secondary | ICD-10-CM

## 2015-10-19 DIAGNOSIS — R0981 Nasal congestion: Secondary | ICD-10-CM | POA: Diagnosis present

## 2015-10-19 HISTORY — DX: Asymptomatic human immunodeficiency virus (hiv) infection status: Z21

## 2015-10-19 HISTORY — DX: Human immunodeficiency virus (HIV) disease: B20

## 2015-10-19 HISTORY — DX: Unspecified osteoarthritis, unspecified site: M19.90

## 2015-10-19 HISTORY — DX: Unspecified asthma, uncomplicated: J45.909

## 2015-10-19 HISTORY — DX: Other seasonal allergic rhinitis: J30.2

## 2015-10-19 MED ORDER — AMOXICILLIN 875 MG PO TABS: 875.0000 mg | ORAL_TABLET | Freq: Two times a day (BID) | ORAL | Status: DC

## 2015-10-19 NOTE — Discharge Instructions (Signed)
Sinus Rinse °WHAT IS A SINUS RINSE? °A sinus rinse is a home treatment. It rinses your sinuses with a mixture of salt and water (saline solution). Sinuses are air-filled spaces in your skull behind the bones of your face and forehead. They open into your nasal cavity. °To do a sinus rinse, you will need: °· Saline solution. °· Neti pot or spray bottle. This releases the saline solution into your nose and through your sinuses. You can buy neti pots and spray bottles at: °· Your local pharmacy. °· A health food store. °· Online. °WHEN WOULD I DO A SINUS RINSE?  °A sinus rinse can help to clear your nasal cavity. It can clear:  °· Mucus. °· Dirt. °· Dust. °· Pollen. °You may do a sinus rinse when you have: °· A cold. °· A virus. °· Allergies. °· A sinus infection. °· A stuffy nose. °If you are considering a sinus rinse: °· Ask your child's doctor before doing a sinus rinse on your child. °· Do not do a sinus rinse if you have had: °¨ Ear or nasal surgery. °¨ An ear infection. °¨ Blocked ears. °HOW DO I DO A SINUS RINSE?  °· Wash your hands. °· Disinfect your device using the directions that came with the device. °· Dry your device. °· Use the solution that comes with your device or one that is sold separately in stores. Follow the mixing directions on the package. °· Fill your device with the amount of saline solution as stated in the device instructions. °· Stand over a sink and tilt your head sideways over the sink. °· Place the spout of the device in your upper nostril (the one closer to the ceiling). °· Gently pour or squeeze the saline solution into the nasal cavity. The liquid should drain to the lower nostril if you are not too congested. °· Gently blow your nose. Blowing too hard may cause ear pain. °· Repeat in the other nostril. °· Clean and rinse your device with clean water. °· Air-dry your device. °ARE THERE RISKS OF A SINUS RINSE?  °Sinus rinse is normally very safe and helpful. However, there are a few  risks, which include:  °· A burning feeling in the sinuses. This may happen if you do not make the saline solution as instructed. Make sure to follow all directions when making the saline solution. °· Infection from unclean water. This is rare, but possible. °· Nasal irritation. °  °This information is not intended to replace advice given to you by your health care provider. Make sure you discuss any questions you have with your health care provider. °  °Document Released: 12/27/2013 Document Reviewed: 12/27/2013 °Elsevier Interactive Patient Education ©2016 [employer] ° °Sinusitis, Adult °Sinusitis is redness, soreness, and puffiness (inflammation) of the air pockets in the bones of your face (sinuses). The redness, soreness, and puffiness can cause air and mucus to get trapped in your sinuses. This can allow germs to grow and cause an infection.  °HOME CARE  °· Drink enough fluids to keep your pee (urine) clear or pale yellow. °· Use a humidifier in your home. °· Run a hot shower to create steam in the bathroom. Sit in the bathroom with the door closed. Breathe in the steam 3-4 times a day. °· Put a warm, moist washcloth on your face 3-4 times a day, or as told by your doctor. °· Use salt water sprays (saline sprays) to wet the thick fluid in your nose. This can   help the sinuses drain. °· Only take medicine as told by your doctor. °GET HELP RIGHT AWAY IF:  °· Your pain gets worse. °· You have very bad headaches. °· You are sick to your stomach (nauseous). °· You throw up (vomit). °· You are very sleepy (drowsy) all the time. °· Your face is puffy (swollen). °· Your vision changes. °· You have a stiff neck. °· You have trouble breathing. °MAKE SURE YOU:  °· Understand these instructions. °· Will watch your condition. °· Will get help right away if you are not doing well or get worse. °  °This information is not intended to replace advice given to you by your health care provider. Make sure you discuss any  questions you have with your health care provider. °  °Document Released: 11/18/2007 Document Revised: 06/22/2014 Document Reviewed: 01/05/2012 °Elsevier Interactive Patient Education ©2016 [employer]vier Inc. ° °

## 2015-10-19 NOTE — ED Notes (Signed)
Pt reports sinus sxs.  Pt reports she woke up with blood on her pillow and states her nose is irritated.  Pt reports she is on medication for allergies-fexofenadine, but is not helping with her sinus sxs. C/o nasal congestion.

## 2015-10-19 NOTE — ED Provider Notes (Signed)
[facility] Emergency Department Provider Note  ____________________________________________  Time seen: Approximately 8:40 AM  I have reviewed the triage vital signs and the nursing notes.   HISTORY  Chief Complaint Nasal Congestion    HPI Sara Mcintyre is a 55 y.o. female , NAD, presents emergency department with four-day history of sinus pressure pain. States she woke this morning with some blood on her pillow and notes that her nose is irritated. Has not had any epistaxis this morning. Has had increased sinus pressure and pain for approximately 4 days. Has had allergy symptoms for 3-4 weeks. Does take fexofenadine daily to control her allergic rhinitis. Has not had any headaches, sore throat, ear pain, cough, chest congestion. Has had no fevers, chills, body aches. Denies any numbness, weakness, tingling. Has not had any chest pain, palpitations, shortness of breath, wheezing. No abdominal pain, nausea, vomiting. No injury or traumas to the face or nose.   Past Medical History  Diagnosis Date  . Seasonal allergies   . Arthritis   . Asthma   . HIV (human immunodeficiency virus infection) [facility])     Patient Active Problem List   Diagnosis Date Noted  . EXTERNAL HEMORRHOIDS WITHOUT MENTION COMP 06/05/2010  . HYPERLIPIDEMIA 05/21/2010  . ASTHMA 05/21/2010  . GERD 05/21/2010  . PNEUMONIA, HX OF 05/21/2010  . HIV INFECTION 05/06/2010  . PAP SMEAR, ABNORMAL, ASCUS 05/06/2010    Past Surgical History  Procedure Laterality Date  . Abdominal hysterectomy      Current Outpatient Rx  Name  Route  Sig  Dispense  Refill  . amoxicillin (AMOXIL) 875 MG tablet   Oral   Take 1 tablet (875 mg total) by mouth 2 (two) times daily.   20 tablet   0   . cyclobenzaprine (FLEXERIL) 10 MG tablet   Oral   Take 1 tablet (10 mg total) by mouth 2 (two) times daily as needed for muscle spasms.   20 tablet   0   . fish oil-omega-3 fatty acids 1000 MG capsule   Oral   Take 1 g by mouth daily.          Marland Kitchen HYDROcodone-acetaminophen (NORCO/VICODIN) 5-325 MG per tablet   Oral   Take 1 tablet by mouth every 4 (four) hours as needed for pain.         Marland Kitchen ibuprofen (ADVIL,MOTRIN) 200 MG tablet   Oral   Take 400 mg by mouth every 6 (six) hours as needed for pain.         . meloxicam (MOBIC) 15 MG tablet   Oral   Take 1 tablet (15 mg total) by mouth daily. Take 1 daily with food.   10 tablet   0   . Multiple Vitamin (MULTIVITAMIN WITH MINERALS) TABS   Oral   Take 1 tablet by mouth daily.         Marland Kitchen oxyCODONE-acetaminophen (PERCOCET) 5-325 MG per tablet   Oral   Take 2 tablets by mouth every 4 (four) hours as needed for pain.   20 tablet   0     Allergies Review of patient's allergies indicates no known allergies.  History reviewed. No pertinent family history.  Social History Social History  Substance Use Topics  . Smoking status: Never Smoker   . Smokeless tobacco: Never Used  . Alcohol Use: 1.0 oz/week    2 Standard drinks or equivalent per week     Review of Systems  Constitutional: No fever/chills, Fatigue Eyes: No  visual changes. No discharge, redness, swelling ENT: Positive nasal congestion, sinus pressure/pain, runny nose, sneezing, epistaxis. No sore throat, ear pain. Cardiovascular: No chest pain, palpitations. Respiratory: No cough. No shortness of breath. No wheezing.  Gastrointestinal: No abdominal pain.  No nausea, vomiting.  Musculoskeletal: Negative for general myalgias.  Skin: Negative for rash. Neurological: Negative for headaches, focal weakness or numbness. No tingling 10-point ROS otherwise negative.  ____________________________________________   PHYSICAL EXAM:  VITAL SIGNS: ED Triage Vitals  Enc Vitals Group     BP 10/19/15 0824 119/76 mmHg     Pulse Rate 10/19/15 0824 85     Resp 10/19/15 0824 18     Temp 10/19/15 0824 99.5 F (37.5 C)     Temp Source 10/19/15 0824 Oral     SpO2  10/19/15 0824 97 %     Weight 10/19/15 0824 154 lb (69.854 kg)     Height 10/19/15 0824 5\' 2"  (1.575 m)     Head Cir --      Peak Flow --      Pain Score 10/19/15 0825 6     Pain Loc --      Pain Edu? --      Excl. in GC? --      Constitutional: Alert and oriented. Well appearing and in no acute distress. Eyes: Conjunctivae are normal. PERRL. EOMI without pain.  Head: Atraumatic. ENT:  Frontal and maxillary sinus tenderness to percussion.      Ears: TMs visualized bilaterally without erythema, bulging, perforation. Mild serous effusion noted bilaterally.      Nose: Moderate congestion with trace clear rhinnorhea. No active epistaxis noted. Turbinates are moderately injected.      Mouth/Throat: Mucous membranes are moist. Pharynx without erythema, swelling, exudates. No postnasal drip. Cobblestoning is noted in the posterior pharynx. Neck: Supple with full range of motion. Hematological/Lymphatic/Immunilogical: No cervical lymphadenopathy. Cardiovascular: Normal rate, regular rhythm. Normal S1 and S2.   Respiratory: Normal respiratory effort without tachypnea or retractions. Lungs CTAB with breath sounds noted in all lung fields. Neurologic:  Normal speech and language. No gross focal neurologic deficits are appreciated.  Skin:  Skin is warm, dry and intact. No rash noted. Psychiatric: Mood and affect are normal. Speech and behavior are normal. Patient exhibits appropriate insight and judgement.   ____________________________________________   LABS  None ____________________________________________  EKG  None ____________________________________________  RADIOLOGY  None ____________________________________________    PROCEDURES  Procedure(s) performed: None    Medications - No data to display   ____________________________________________   INITIAL IMPRESSION / ASSESSMENT AND PLAN / ED COURSE  Patient's diagnosis is consistent with acute maxillary  sinusitis due to chronic allergic rhinitis. Patient will be discharged home with prescriptions for amoxicillin to take as directed. Patient should continue fexofenadine as previously prescribed to control allergy symptoms. Patient may use a saline based nasal spray as needed. Patient is to follow up with her primary care provider in 3 days if symptoms persist. Patient is given ED precautions to return to the ED for any worsening or new symptoms.    ____________________________________________  FINAL CLINICAL IMPRESSION(S) / ED DIAGNOSES  Final diagnoses:  Acute maxillary sinusitis, recurrence not specified  Allergic rhinitis due to pollen      NEW MEDICATIONS STARTED DURING THIS VISIT:  New Prescriptions   AMOXICILLIN (AMOXIL) 875 MG TABLET    Take 1 tablet (875 mg total) by mouth 2 (two) times daily.         Hope Pigeon, PA-C 10/19/15  [id number]  Gayla DossEryka A Gayle, MD 10/19/15 [phone]

## 2016-01-13 ENCOUNTER — Other Ambulatory Visit: Payer: Self-pay | Admitting: Family Medicine

## 2016-01-13 ENCOUNTER — Ambulatory Visit
Admission: RE | Admit: 2016-01-13 | Discharge: 2016-01-13 | Disposition: A | Discharge status: Home / Self Care | Payer: Medicare Other | Source: Ambulatory Visit | Attending: Family Medicine | Admitting: Family Medicine

## 2016-01-13 DIAGNOSIS — M545 Low back pain: Secondary | ICD-10-CM | POA: Diagnosis present

## 2016-01-13 DIAGNOSIS — M5126 Other intervertebral disc displacement, lumbar region: Secondary | ICD-10-CM | POA: Diagnosis not present

## 2016-01-13 DIAGNOSIS — M5137 Other intervertebral disc degeneration, lumbosacral region: Secondary | ICD-10-CM | POA: Insufficient documentation

## 2016-01-13 DIAGNOSIS — M5136 Other intervertebral disc degeneration, lumbar region: Secondary | ICD-10-CM | POA: Insufficient documentation

## 2016-07-20 ENCOUNTER — Encounter: Payer: Self-pay | Admitting: *Deleted

## 2016-07-20 ENCOUNTER — Ambulatory Visit: Payer: Medicare Other | Admitting: Anesthesiology

## 2016-07-20 ENCOUNTER — Ambulatory Visit
Admission: RE | Admit: 2016-07-20 | Discharge: 2016-07-20 | Disposition: A | Discharge status: Home / Self Care | Payer: Medicare Other | Source: Ambulatory Visit | Attending: Gastroenterology | Admitting: Gastroenterology

## 2016-07-20 ENCOUNTER — Encounter
Admission: RE | Disposition: A | Discharge status: Home / Self Care | Payer: Self-pay | Source: Ambulatory Visit | Attending: Gastroenterology

## 2016-07-20 DIAGNOSIS — K219 Gastro-esophageal reflux disease without esophagitis: Secondary | ICD-10-CM | POA: Insufficient documentation

## 2016-07-20 DIAGNOSIS — M1990 Unspecified osteoarthritis, unspecified site: Secondary | ICD-10-CM | POA: Insufficient documentation

## 2016-07-20 DIAGNOSIS — E785 Hyperlipidemia, unspecified: Secondary | ICD-10-CM | POA: Diagnosis not present

## 2016-07-20 DIAGNOSIS — F329 Major depressive disorder, single episode, unspecified: Secondary | ICD-10-CM | POA: Insufficient documentation

## 2016-07-20 DIAGNOSIS — Z1211 Encounter for screening for malignant neoplasm of colon: Secondary | ICD-10-CM | POA: Diagnosis not present

## 2016-07-20 DIAGNOSIS — Z79899 Other long term (current) drug therapy: Secondary | ICD-10-CM | POA: Insufficient documentation

## 2016-07-20 DIAGNOSIS — J45909 Unspecified asthma, uncomplicated: Secondary | ICD-10-CM | POA: Diagnosis not present

## 2016-07-20 HISTORY — DX: Unspecified hemorrhoids: K64.9

## 2016-07-20 HISTORY — DX: Abnormal uterine and vaginal bleeding, unspecified: N93.9

## 2016-07-20 HISTORY — DX: Low back pain, unspecified: M54.50

## 2016-07-20 HISTORY — DX: Low back pain: M54.5

## 2016-07-20 HISTORY — PX: COLONOSCOPY WITH PROPOFOL: SHX5780

## 2016-07-20 HISTORY — DX: Hyperlipidemia, unspecified: E78.5

## 2016-07-20 HISTORY — DX: Depression, unspecified: F32.A

## 2016-07-20 HISTORY — DX: Major depressive disorder, single episode, unspecified: F32.9

## 2016-07-20 HISTORY — DX: Gastro-esophageal reflux disease without esophagitis: K21.9

## 2016-07-20 SURGERY — COLONOSCOPY WITH PROPOFOL
Anesthesia: General

## 2016-07-20 MED ORDER — PROPOFOL 500 MG/50ML IV EMUL
INTRAVENOUS | Status: DC | PRN
  Administered 2016-07-20: 140 ug/kg/min via INTRAVENOUS

## 2016-07-20 MED ORDER — MIDAZOLAM HCL 2 MG/2ML IJ SOLN
INTRAMUSCULAR | Status: AC
  Filled 2016-07-20: qty 2

## 2016-07-20 MED ORDER — SODIUM CHLORIDE 0.9 % IV SOLN
INTRAVENOUS | Status: DC
  Administered 2016-07-20 (×2): via INTRAVENOUS

## 2016-07-20 MED ORDER — MIDAZOLAM HCL 2 MG/2ML IJ SOLN
INTRAMUSCULAR | Status: DC | PRN
  Administered 2016-07-20: 1 mg via INTRAVENOUS

## 2016-07-20 MED ORDER — FENTANYL CITRATE (PF) 100 MCG/2ML IJ SOLN
INTRAMUSCULAR | Status: AC
  Filled 2016-07-20: qty 2

## 2016-07-20 MED ORDER — SODIUM CHLORIDE 0.9 % IV SOLN: INTRAVENOUS | Status: DC

## 2016-07-20 MED ORDER — LIDOCAINE HCL (CARDIAC) 20 MG/ML IV SOLN
INTRAVENOUS | Status: DC | PRN
  Administered 2016-07-20: 60 mg via INTRAVENOUS

## 2016-07-20 MED ORDER — PROPOFOL 10 MG/ML IV BOLUS
INTRAVENOUS | Status: DC | PRN
  Administered 2016-07-20: 45 mg via INTRAVENOUS

## 2016-07-20 MED ORDER — PROPOFOL 500 MG/50ML IV EMUL
INTRAVENOUS | Status: AC
  Filled 2016-07-20: qty 50

## 2016-07-20 MED ORDER — FENTANYL CITRATE (PF) 100 MCG/2ML IJ SOLN
INTRAMUSCULAR | Status: DC | PRN
  Administered 2016-07-20: 50 ug via INTRAVENOUS

## 2016-07-20 NOTE — Anesthesia Preprocedure Evaluation (Signed)
Anesthesia Evaluation  Patient identified by MRN, date of birth, ID band Patient awake    Reviewed: Allergy & Precautions, H&P , NPO status , Patient's Chart, lab work & pertinent test results, reviewed documented beta blocker date and time   History of Anesthesia Complications Negative for: history of anesthetic complications  Airway Mallampati: II  TM Distance: >3 FB Neck ROM: full    Dental  (+) Missing, Poor Dentition   Pulmonary neg pulmonary ROS, shortness of breath and with exertion, asthma , neg sleep apnea, neg COPD, neg recent URI,           Cardiovascular Exercise Tolerance: Good negative cardio ROS       Neuro/Psych PSYCHIATRIC DISORDERS (Depression) negative neurological ROS     GI/Hepatic Neg liver ROS, GERD  ,  Endo/Other  negative endocrine ROS  Renal/GU negative Renal ROS  negative genitourinary   Musculoskeletal   Abdominal   Peds  Hematology negative hematology ROS (+)   Anesthesia Other Findings Past Medical History: No date: Abnormal vaginal bleeding No date: Arthritis No date: Asthma No date: Depression No date: GERD (gastroesophageal reflux disease) No date: Hemorrhoids No date: HIV (human immunodeficiency virus infection) (* No date: Hyperlipidemia No date: Low back pain No date: Seasonal allergies   Reproductive/Obstetrics negative OB ROS                             Anesthesia Physical Anesthesia Plan  ASA: III  Anesthesia Plan: General   Post-op Pain Management:    Induction:   Airway Management Planned:   Additional Equipment:   Intra-op Plan:   Post-operative Plan:   Informed Consent: I have reviewed the patients History and Physical, chart, labs and discussed the procedure including the risks, benefits and alternatives for the proposed anesthesia with the patient or authorized representative who has indicated his/her understanding and  acceptance.   Dental Advisory Given  Plan Discussed with: Anesthesiologist, CRNA and Surgeon  Anesthesia Plan Comments:         Anesthesia Quick Evaluation

## 2016-07-20 NOTE — Op Note (Signed)
[facility] Gastroenterology Patient Name: Sara Mcintyre Procedure Date: 07/20/2016 1:47 PM MRN: [id number] Account #: [id number] Date of Birth: 03-29-1961 Admit Type: Outpatient Age: 56 Room: [facility] ENDO ROOM 3 Gender: Female Note Status: Finalized Procedure:            Colonoscopy Indications:          Screening for colorectal malignant neoplasm Providers:            Christena Deem, MD Referring MD:         Pollyann Samples. Mancheno Revelo (Referring MD) Medicines:            Monitored Anesthesia Care Complications:        No immediate complications. Procedure:            Pre-Anesthesia Assessment:                       - ASA Grade Assessment: III - A patient with severe                        systemic disease.                       After obtaining informed consent, the colonoscope was                        passed under direct vision. Throughout the procedure,                        the patient's blood pressure, pulse, and oxygen                        saturations were monitored continuously. The                        Colonoscope was introduced through the anus and                        advanced to the the cecum, identified by appendiceal                        orifice and ileocecal valve. The colonoscopy was                        performed without difficulty. The patient tolerated the                        procedure well. The quality of the bowel preparation                        was fair. Findings:      The colon (entire examined portion) appeared normal.      The retroflexed view of the distal rectum and anal verge was normal and       showed no anal or rectal abnormalities.      The digital rectal exam was normal. Impression:           - Preparation of the colon was fair.                       - The entire examined colon is normal.                       -  The distal rectum and anal verge are normal on                        retroflexion view.           - No specimens collected. Recommendation:       - Discharge patient to home.                       - Repeat colonoscopy in 10 years for screening purposes. Procedure Code(s):    --- Professional ---                       [phone], Colonoscopy, flexible; diagnostic, including                        collection of specimen(s) by brushing or washing, when                        performed (separate procedure) Diagnosis Code(s):    --- Professional ---                       Z12.11, Encounter for screening for malignant neoplasm                        of colon CPT copyright 2016 American Medical Association. All rights reserved. The codes documented in this report are preliminary and upon coder review may  be revised to meet current compliance requirements. Christena DeemMartin U Skulskie, MD 07/20/2016 2:12:09 PM This report has been signed electronically. Number of Addenda: 0 Note Initiated On: 07/20/2016 1:47 PM Scope Withdrawal Time: 0 hours 5 minutes 15 seconds  Total Procedure Duration: 0 hours 11 minutes 58 seconds       [facility]

## 2016-07-20 NOTE — Transfer of Care (Signed)
Immediate Anesthesia Transfer of Care Note  Patient: Sara Mcintyre  Procedure(s) Performed: Procedure(s): COLONOSCOPY WITH PROPOFOL (N/A)  Patient Location: PACU  Anesthesia Type:General  Level of Consciousness: sedated  Airway & Oxygen Therapy: Patient Spontanous Breathing and Patient connected to nasal cannula oxygen  Post-op Assessment: Report given to RN and Post -op Vital signs reviewed and stable  Post vital signs: Reviewed and stable  Last Vitals:  Vitals:   07/20/16 1301 07/20/16 1415  BP: 136/81 108/72  Pulse: 70   Resp: 20   Temp: 36.6 C 36.1 C    Last Pain:  Vitals:   07/20/16 1415  TempSrc: Tympanic  PainSc:          Complications: No apparent anesthesia complications

## 2016-07-20 NOTE — H&P (Signed)
Outpatient short stay form Pre-procedure 07/20/2016 1:44 PM Christena DeemMartin U Iqra Rotundo MD  Primary Physician: Dr. Francina AmesAdrian Manchero-Revelo  Reason for visit:  Colonoscopy  History of present illness:  Patient is a 56 year old female presenting today as above. She tolerated her prep well. She takes no aspirin or blood thinning agents.    Current Facility-Administered Medications:  .  0.9 %  sodium chloride infusion, , Intravenous, Continuous, Christena DeemMartin U Deshon Koslowski, MD, Last Rate: 20 mL/hr at 07/20/16 1317 .  0.9 %  sodium chloride infusion, , Intravenous, Continuous, Christena DeemMartin U Leshawn Straka, MD  Prescriptions Prior to Admission  Medication Sig Dispense Refill Last Dose  . ARIPiprazole (ABILIFY) 10 MG tablet Take 10 mg by mouth daily.   Past Week at Unknown time  . cyclobenzaprine (FLEXERIL) 10 MG tablet Take 1 tablet (10 mg total) by mouth 2 (two) times daily as needed for muscle spasms. 20 tablet 0 Past Week at Unknown time  . emtricitabine-tenofovir (TRUVADA) 200-300 MG tablet Take 1 tablet by mouth daily.   07/19/2016 at Unknown time  . fexofenadine (ALLEGRA) 180 MG tablet Take 180 mg by mouth daily.   Past Week at Unknown time  . ibuprofen (ADVIL,MOTRIN) 200 MG tablet Take 400 mg by mouth every 6 (six) hours as needed for pain.   Past Week at Unknown time  . meloxicam (MOBIC) 15 MG tablet Take 1 tablet (15 mg total) by mouth daily. Take 1 daily with food. 10 tablet 0 Past Week at Unknown time  . Multiple Vitamin (MULTIVITAMIN WITH MINERALS) TABS Take 1 tablet by mouth daily.   Past Month at Unknown time  . omeprazole (PRILOSEC) 20 MG capsule Take 20 mg by mouth daily.   Past Week at Unknown time  . oxyCODONE-acetaminophen (PERCOCET) 5-325 MG per tablet Take 2 tablets by mouth every 4 (four) hours as needed for pain. 20 tablet 0 Past Week at Unknown time  . raltegravir (ISENTRESS) 400 MG tablet Take 400 mg by mouth 2 (two) times daily.   07/20/2016 at 0545  . amoxicillin (AMOXIL) 875 MG tablet Take 1 tablet (875  mg total) by mouth 2 (two) times daily. (Patient not taking: Reported on 07/20/2016) 20 tablet 0 Completed Course at Unknown time  . fish oil-omega-3 fatty acids 1000 MG capsule Take 1 g by mouth daily.    Not Taking at Unknown time  . HYDROcodone-acetaminophen (NORCO/VICODIN) 5-325 MG per tablet Take 1 tablet by mouth every 4 (four) hours as needed for pain.   Not Taking at Unknown time     No Known Allergies   Past Medical History:  Diagnosis Date  . Abnormal vaginal bleeding   . Arthritis   . Asthma   . Depression   . GERD (gastroesophageal reflux disease)   . Hemorrhoids   . HIV (human immunodeficiency virus infection) (HCC)   . Hyperlipidemia   . Low back pain   . Seasonal allergies     Review of systems:      Physical Exam    Heart and lungs: Regular rate and rhythm without rub or gallop, lungs are bilaterally clear.    HEENT: Normocephalic atraumatic eyes are anicteric    Other:     Pertinant exam for procedure: Soft nontender nondistended bowel sounds positive normoactive.    Planned proceedures: Colonoscopy and indicated procedures. I have discussed the risks benefits and complications of procedures to include not limited to bleeding, infection, perforation and the risk of sedation and the patient wishes to proceed.    Daphine DeutscherMartin  Cyndia Skeeters, MD Gastroenterology 07/20/2016  1:44 PM

## 2016-07-20 NOTE — Anesthesia Postprocedure Evaluation (Signed)
Anesthesia Post Note  Patient: Sara Mcintyre  Procedure(s) Performed: Procedure(s) (LRB): COLONOSCOPY WITH PROPOFOL (N/A)  Patient location during evaluation: PACU Anesthesia Type: General Level of consciousness: awake and alert Pain management: pain level controlled Vital Signs Assessment: post-procedure vital signs reviewed and stable Respiratory status: spontaneous breathing and respiratory function stable Cardiovascular status: stable Anesthetic complications: no     Last Vitals:  Vitals:   07/20/16 1301 07/20/16 1415  BP: 136/81 108/72  Pulse: 70   Resp: 20   Temp: 36.6 C 36.1 C    Last Pain:  Vitals:   07/20/16 1415  TempSrc: Tympanic  PainSc:                  KEPHART,WILLIAM K

## 2016-07-20 NOTE — Anesthesia Post-op Follow-up Note (Cosign Needed)
Anesthesia QCDR form completed.        

## 2016-07-21 ENCOUNTER — Encounter: Payer: Self-pay | Admitting: Gastroenterology

## 2016-08-12 ENCOUNTER — Other Ambulatory Visit: Payer: Self-pay | Admitting: Family Medicine

## 2016-08-12 DIAGNOSIS — Z1231 Encounter for screening mammogram for malignant neoplasm of breast: Secondary | ICD-10-CM

## 2016-09-09 ENCOUNTER — Ambulatory Visit
Admission: RE | Admit: 2016-09-09 | Discharge: 2016-09-09 | Disposition: A | Discharge status: Home / Self Care | Payer: Medicare Other | Source: Ambulatory Visit | Attending: Family Medicine | Admitting: Family Medicine

## 2016-09-09 DIAGNOSIS — Z1231 Encounter for screening mammogram for malignant neoplasm of breast: Secondary | ICD-10-CM | POA: Insufficient documentation

## 2016-09-15 ENCOUNTER — Inpatient Hospital Stay
Admission: RE | Admit: 2016-09-15 | Discharge: 2016-09-15 | Disposition: A | Discharge status: Home / Self Care | Payer: Self-pay | Source: Ambulatory Visit | Attending: *Deleted | Admitting: *Deleted

## 2016-09-15 ENCOUNTER — Other Ambulatory Visit: Payer: Self-pay | Admitting: *Deleted

## 2016-09-15 DIAGNOSIS — Z9289 Personal history of other medical treatment: Secondary | ICD-10-CM

## 2017-06-25 ENCOUNTER — Ambulatory Visit (INDEPENDENT_AMBULATORY_CARE_PROVIDER_SITE_OTHER): Payer: Medicare Other | Admitting: Psychiatry

## 2017-06-25 ENCOUNTER — Other Ambulatory Visit: Payer: Self-pay

## 2017-06-25 ENCOUNTER — Encounter: Payer: Self-pay | Admitting: Psychiatry

## 2017-06-25 VITALS — BP 132/74 | HR 76 | Temp 98.5°F | Wt 160.4 lb

## 2017-06-25 DIAGNOSIS — F4310 Post-traumatic stress disorder, unspecified: Secondary | ICD-10-CM | POA: Diagnosis not present

## 2017-06-25 DIAGNOSIS — M1990 Unspecified osteoarthritis, unspecified site: Secondary | ICD-10-CM | POA: Insufficient documentation

## 2017-06-25 DIAGNOSIS — F200 Paranoid schizophrenia: Secondary | ICD-10-CM

## 2017-06-25 MED ORDER — ARIPIPRAZOLE 10 MG PO TABS
10.0000 mg | ORAL_TABLET | Freq: Every day | ORAL | 0 refills | Status: DC
Start: 2017-06-25 — End: 2017-09-01

## 2017-06-25 MED ORDER — DULOXETINE HCL 30 MG PO CPEP: 30.0000 mg | ORAL_CAPSULE | Freq: Every day | ORAL | 2 refills | Status: DC
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
## 2017-06-25 NOTE — Patient Instructions (Signed)

## 2017-07-12 ENCOUNTER — Encounter: Payer: Self-pay | Admitting: Emergency Medicine

## 2017-07-12 ENCOUNTER — Emergency Department
Admission: EM | Admit: 2017-07-12 | Discharge: 2017-07-12 | Disposition: A | Discharge status: Home / Self Care | Payer: Medicare Other | Attending: Emergency Medicine | Admitting: Emergency Medicine

## 2017-07-12 DIAGNOSIS — R1110 Vomiting, unspecified: Secondary | ICD-10-CM | POA: Diagnosis not present

## 2017-07-12 DIAGNOSIS — J45909 Unspecified asthma, uncomplicated: Secondary | ICD-10-CM | POA: Insufficient documentation

## 2017-07-12 DIAGNOSIS — R51 Headache: Secondary | ICD-10-CM | POA: Diagnosis not present

## 2017-07-12 DIAGNOSIS — R05 Cough: Secondary | ICD-10-CM | POA: Diagnosis not present

## 2017-07-12 DIAGNOSIS — B20 Human immunodeficiency virus [HIV] disease: Secondary | ICD-10-CM | POA: Diagnosis not present

## 2017-07-12 DIAGNOSIS — F17210 Nicotine dependence, cigarettes, uncomplicated: Secondary | ICD-10-CM | POA: Insufficient documentation

## 2017-07-12 DIAGNOSIS — R69 Illness, unspecified: Secondary | ICD-10-CM | POA: Diagnosis not present

## 2017-07-12 DIAGNOSIS — J3489 Other specified disorders of nose and nasal sinuses: Secondary | ICD-10-CM | POA: Insufficient documentation

## 2017-07-12 DIAGNOSIS — R0981 Nasal congestion: Secondary | ICD-10-CM | POA: Diagnosis not present

## 2017-07-12 DIAGNOSIS — R197 Diarrhea, unspecified: Secondary | ICD-10-CM | POA: Diagnosis present

## 2017-07-12 DIAGNOSIS — Z79899 Other long term (current) drug therapy: Secondary | ICD-10-CM | POA: Diagnosis not present

## 2017-07-12 DIAGNOSIS — J111 Influenza due to unidentified influenza virus with other respiratory manifestations: Secondary | ICD-10-CM

## 2017-07-12 MED ORDER — OSELTAMIVIR PHOSPHATE 75 MG PO CAPS: 75.0000 mg | ORAL_CAPSULE | Freq: Two times a day (BID) | ORAL | 0 refills | Status: AC

## 2017-07-12 NOTE — ED Provider Notes (Signed)
[facility] Emergency Department Provider Note  ____________________________________________  Time seen: Approximately 7:19 PM  I have reviewed the triage vital signs and the nursing notes.   HISTORY  Chief Complaint Diarrhea    HPI Edda Orea is a 57 y.o. female presents to the emergency department with headache, emesis, diarrhea, rhinorrhea, congestion and nonproductive cough for the past 2 days.  Patient's grandchildren have similar symptoms.  No recent travel.  Patient has a history of HIV and is under the care of infectious disease.  She denies chest pain, chest tightness, shortness of breath, nausea, vomiting abdominal pain.  Past Medical History:  Diagnosis Date  . Abnormal vaginal bleeding   . Arthritis   . Asthma   . Bipolar disorder (HCC)   . Depression   . GERD (gastroesophageal reflux disease)   . Hemorrhoids   . HIV (human immunodeficiency virus infection) (HCC)   . Hyperlipidemia   . Low back pain   . Schizophrenia (HCC)   . Seasonal allergies     Patient Active Problem List   Diagnosis Date Noted  . Arthritis 06/25/2017  . EXTERNAL HEMORRHOIDS WITHOUT MENTION COMP 06/05/2010  . HYPERLIPIDEMIA 05/21/2010  . ASTHMA 05/21/2010  . GERD 05/21/2010  . PNEUMONIA, HX OF 05/21/2010  . HIV INFECTION 05/06/2010  . PAP SMEAR, ABNORMAL, ASCUS 05/06/2010    Past Surgical History:  Procedure Laterality Date  . ABDOMINAL HYSTERECTOMY    . COLONOSCOPY WITH PROPOFOL N/A 07/20/2016   Procedure: COLONOSCOPY WITH PROPOFOL;  Surgeon: Christena Deem, MD;  Location: [facility] ENDOSCOPY;  Service: Endoscopy;  Laterality: N/A;    Prior to Admission medications   Medication Sig Start Date End Date Taking? Authorizing Provider  ARIPiprazole (ABILIFY) 10 MG tablet Take 1 tablet (10 mg total) by mouth daily. 06/25/17   Jomarie Longs, MD  Cholecalciferol (VITAMIN D3) 5000 units CAPS Take by mouth. 03/18/11   [provider]  DULoxetine  (CYMBALTA) 30 MG capsule Take 1 capsule (30 mg total) by mouth daily. 06/25/17   Jomarie Longs, MD  emtricitabine-tenofovir (TRUVADA) 200-300 MG tablet Take 1 tablet by mouth daily.    [provider]  fexofenadine (ALLEGRA) 180 MG tablet Take 180 mg by mouth daily.    [provider]  ibuprofen (ADVIL,MOTRIN) 200 MG tablet Take 400 mg by mouth every 6 (six) hours as needed for pain.    [provider]  meloxicam (MOBIC) 15 MG tablet Take 1 tablet (15 mg total) by mouth daily. Take 1 daily with food. 11/15/12   Arthor Captain, PA-C  Multiple Vitamin (MULTIVITAMIN WITH MINERALS) TABS Take 1 tablet by mouth daily.    [provider]  omeprazole (PRILOSEC) 20 MG capsule Take 20 mg by mouth daily.    [provider]  oseltamivir (TAMIFLU) 75 MG capsule Take 1 capsule (75 mg total) by mouth 2 (two) times daily for 5 days. 07/12/17 07/17/17  Orvil Feil, PA-C  raltegravir (ISENTRESS) 400 MG tablet Take 400 mg by mouth 2 (two) times daily.    [provider]  tiZANidine (ZANAFLEX) 4 MG tablet Take 4 mg by mouth every 6 (six) hours as needed for muscle spasms.    [provider]    Allergies Patient has no known allergies.  Family History  Problem Relation Age of Onset  . Depression Brother   . Anxiety disorder Brother     Social History Social History   Tobacco Use  . Smoking status: Former Smoker  Types: Cigarettes    Last attempt to quit: 06/25/1977    Years since quitting: 40.0  . Smokeless tobacco: Never Used  Substance Use Topics  . Alcohol use: Yes    Alcohol/week: 2.4 - 4.2 oz    Types: 2 Standard drinks or equivalent, 1 - 4 Cans of beer, 1 Glasses of wine per week  . Drug use: No    Review of Systems  Constitutional: Patient has fever.  ENT: Patient has congestion, rhinorrhea.  Respiratory: Patient has cough.  Gastrointestinal: Patient has diarrhea and emesis.  Skin: Negative for rash, abrasions, lacerations,  ecchymosis.   ____________________________________________   PHYSICAL EXAM:  VITAL SIGNS: ED Triage Vitals [07/12/17 1736]  Enc Vitals Group     BP (!) 133/43     Pulse Rate 97     Resp 18     Temp 98.8 F (37.1 C)     Temp Source Oral     SpO2 95 %     Weight 160 lb (72.6 kg)     Height 5\' 2"  (1.575 m)     Head Circumference      Peak Flow      Pain Score      Pain Loc      Pain Edu?      Excl. in GC?      Constitutional: Alert and oriented. Patient is lying supine. Eyes: Conjunctivae are normal. PERRL. EOMI. Head: Atraumatic. ENT:      Ears: Tympanic membranes are mildly injected with mild effusion bilaterally.       Nose: No congestion/rhinnorhea.      Mouth/Throat: Mucous membranes are moist. Posterior pharynx is mildly erythematous.  Hematological/Lymphatic/Immunilogical: No cervical lymphadenopathy.  Cardiovascular: Normal rate, regular rhythm. Normal S1 and S2.  Good peripheral circulation. Respiratory: Normal respiratory effort without tachypnea or retractions. Lungs CTAB. Good air entry to the bases with no decreased or absent breath sounds. Gastrointestinal: Bowel sounds 4 quadrants. Soft and nontender to palpation. No guarding or rigidity. No palpable masses. No distention. No CVA tenderness. Musculoskeletal: Full range of motion to all extremities. No gross deformities appreciated. Neurologic:  Normal speech and language. No gross focal neurologic deficits are appreciated.  Skin:  Skin is warm, dry and intact. No rash noted. Psychiatric: Mood and affect are normal. Speech and behavior are normal. Patient exhibits appropriate insight and judgement.   ____________________________________________   LABS (all labs ordered are listed, but only abnormal results are displayed)  Labs Reviewed - No data to display ____________________________________________  EKG   ____________________________________________  RADIOLOGY  No results  found.  ____________________________________________    PROCEDURES  Procedure(s) performed:    Procedures    Medications - No data to display   ____________________________________________   INITIAL IMPRESSION / ASSESSMENT AND PLAN / ED COURSE  Pertinent labs & imaging results that were available during my care of the patient were reviewed by me and considered in my medical decision making (see chart for details).  Review of the [place] CSRS was performed in accordance of the [facility] prior to dispensing any controlled drugs.    Assessment and plan Differential diagnosis includes influenza versus unspecified viral URI Patient presents to the emergency department with rhinorrhea, congestion, nonproductive cough, fever, chills and diarrhea.  Her history is consistent with an influenza-like illness.  Patient was treated empirically with Tamiflu.  She was advised to follow-up with primary care as needed.  All patient questions were answered.   ____________________________________________  FINAL CLINICAL IMPRESSION(S) / ED DIAGNOSES  Final diagnoses:  Influenza-like illness      NEW MEDICATIONS STARTED DURING THIS VISIT:  ED Discharge Orders        Ordered    oseltamivir (TAMIFLU) 75 MG capsule  2 times daily     07/12/17 1823          This chart was dictated using voice recognition software/Dragon. Despite best efforts to proofread, errors can occur which can change the meaning. Any change was purely unintentional.    Orvil Feil, PA-C 07/12/17 1921    Jeanmarie Plant, MD 07/12/17 2013

## 2017-07-12 NOTE — ED Notes (Signed)
See triage note  Presents with some nausea  States she developed and diarrhea since Saturday  Denies any fever

## 2017-07-12 NOTE — ED Triage Notes (Signed)
Pt in via POV; is here to be evaluated for diarrhea since Saturday, denies any N/V.  Pt reports grandchildren with similar symptoms.  Vitals WDL, NAD noted at this time.

## 2017-07-26 ENCOUNTER — Ambulatory Visit: Payer: Medicare Other | Admitting: Licensed Clinical Social Worker

## 2017-07-26 ENCOUNTER — Ambulatory Visit: Payer: Medicaid Other | Admitting: Psychiatry

## 2017-08-04 ENCOUNTER — Encounter: Payer: Self-pay | Admitting: Psychiatry

## 2017-08-04 ENCOUNTER — Ambulatory Visit (INDEPENDENT_AMBULATORY_CARE_PROVIDER_SITE_OTHER): Payer: Medicare Other | Admitting: Psychiatry

## 2017-08-04 ENCOUNTER — Other Ambulatory Visit: Payer: Self-pay

## 2017-08-04 ENCOUNTER — Other Ambulatory Visit
Admission: RE | Admit: 2017-08-04 | Discharge: 2017-08-04 | Disposition: A | Discharge status: Home / Self Care | Payer: Medicare Other | Source: Ambulatory Visit | Attending: Psychiatry | Admitting: Psychiatry

## 2017-08-04 VITALS — BP 151/78 | HR 68 | Temp 98.3°F | Wt 157.4 lb

## 2017-08-04 DIAGNOSIS — F200 Paranoid schizophrenia: Secondary | ICD-10-CM

## 2017-08-04 DIAGNOSIS — F4310 Post-traumatic stress disorder, unspecified: Secondary | ICD-10-CM | POA: Diagnosis not present

## 2017-08-04 LAB — LIPID PANEL
CHOLESTEROL: 232 mg/dL — AB (ref 0–200)
HDL: 73 mg/dL (ref >40)
LDL CALC: 147 mg/dL — AB (ref 0–99)
Total CHOL/HDL Ratio: 3.2 RATIO
Triglycerides: 59 mg/dL (ref <150)
VLDL: 12 mg/dL (ref 0–40)

## 2017-08-04 LAB — HEMOGLOBIN A1C
Hgb A1c MFr Bld: 6.2 % — ABNORMAL HIGH (ref 4.8–5.6)
MEAN PLASMA GLUCOSE: 131.24 mg/dL

## 2017-08-04 LAB — FOLATE: FOLATE: 13.8 ng/mL (ref >5.9)

## 2017-08-04 LAB — TSH: TSH: 1.693 u[IU]/mL (ref 0.350–4.500)

## 2017-08-04 MED ORDER — CITALOPRAM HYDROBROMIDE 10 MG PO TABS: 5.0000 mg | ORAL_TABLET | Freq: Every day | ORAL | 1 refills | Status: DC

## 2017-08-04 NOTE — Patient Instructions (Signed)
Citalopram tablets What is this medicine? CITALOPRAM (sye TAL oh pram) is a medicine for depression. This medicine may be used for other purposes; ask your health care provider or pharmacist if you have questions. COMMON BRAND NAME(S): Celexa What should I tell my health care provider before I take this medicine? They need to know if you have any of these conditions: -bleeding disorders -bipolar disorder or a family history of bipolar disorder -glaucoma -heart disease -history of irregular heartbeat -kidney disease -liver disease -low levels of magnesium or potassium in the blood -receiving electroconvulsive therapy -seizures -suicidal thoughts, plans, or attempt; a previous suicide attempt by you or a family member -take medicines that treat or prevent blood clots -thyroid disease -an unusual or allergic reaction to citalopram, escitalopram, other medicines, foods, dyes, or preservatives -pregnant or trying to become pregnant -breast-feeding How should I use this medicine? Take this medicine by mouth with a glass of water. Follow the directions on the prescription label. You can take it with or without food. Take your medicine at regular intervals. Do not take your medicine more often than directed. Do not stop taking this medicine suddenly except upon the advice of your doctor. Stopping this medicine too quickly may cause serious side effects or your condition may worsen. A special MedGuide will be given to you by the pharmacist with each prescription and refill. Be sure to read this information carefully each time. Talk to your pediatrician regarding the use of this medicine in children. Special care may be needed. Patients over 56 years old may have a stronger reaction and need a smaller dose. Overdosage: If you think you have taken too much of this medicine contact a poison control center or emergency room at once. NOTE: This medicine is only for you. Do not share this medicine with  others. What if I miss a dose? If you miss a dose, take it as soon as you can. If it is almost time for your next dose, take only that dose. Do not take double or extra doses. What may interact with this medicine? Do not take this medicine with any of the following medications: -certain medicines for fungal infections like fluconazole, itraconazole, ketoconazole, posaconazole, voriconazole -cisapride -dofetilide -dronedarone -escitalopram -linezolid -MAOIs like Carbex, Eldepryl, Marplan, Nardil, and Parnate -methylene blue (injected into a vein) -pimozide -thioridazine -ziprasidone This medicine may also interact with the following medications: -alcohol -amphetamines -aspirin and aspirin-like medicines -carbamazepine -certain medicines for depression, anxiety, or psychotic disturbances -certain medicines for infections like chloroquine, clarithromycin, erythromycin, furazolidone, isoniazid, pentamidine -certain medicines for migraine headaches like almotriptan, eletriptan, frovatriptan, naratriptan, rizatriptan, sumatriptan, zolmitriptan -certain medicines for sleep -certain medicines that treat or prevent blood clots like dalteparin, enoxaparin, warfarin -cimetidine -diuretics -fentanyl -lithium -methadone -metoprolol -NSAIDs, medicines for pain and inflammation, like ibuprofen or naproxen -omeprazole -other medicines that prolong the QT interval (cause an abnormal heart rhythm) -procarbazine -rasagiline -supplements like St. John's wort, kava kava, valerian -tramadol -tryptophan This list may not describe all possible interactions. Give your health care provider a list of all the medicines, herbs, non-prescription drugs, or dietary supplements you use. Also tell them if you smoke, drink alcohol, or use illegal drugs. Some items may interact with your medicine. What should I watch for while using this medicine? Tell your doctor if your symptoms do not get better or if they  get worse. Visit your doctor or health care professional for regular checks on your progress. Because it may take several weeks to see the full   effects of this medicine, it is important to continue your treatment as prescribed by your doctor. Patients and their families should watch out for new or worsening thoughts of suicide or depression. Also watch out for sudden changes in feelings such as feeling anxious, agitated, panicky, irritable, hostile, aggressive, impulsive, severely restless, overly excited and hyperactive, or not being able to sleep. If this happens, especially at the beginning of treatment or after a change in dose, call your health care professional. You may get drowsy or dizzy. Do not drive, use machinery, or do anything that needs mental alertness until you know how this medicine affects you. Do not stand or sit up quickly, especially if you are an older patient. This reduces the risk of dizzy or fainting spells. Alcohol may interfere with the effect of this medicine. Avoid alcoholic drinks. Your mouth may get dry. Chewing sugarless gum or sucking hard candy, and drinking plenty of water will help. Contact your doctor if the problem does not go away or is severe. What side effects may I notice from receiving this medicine? Side effects that you should report to your doctor or health care professional as soon as possible: -allergic reactions like skin rash, itching or hives, swelling of the face, lips, or tongue -anxious -black, tarry stools -breathing problems -changes in vision -chest pain -confusion -elevated mood, decreased need for sleep, racing thoughts, impulsive behavior -eye pain -fast, irregular heartbeat -feeling faint or lightheaded, falls -feeling agitated, angry, or irritable -hallucination, loss of contact with reality -loss of balance or coordination -loss of memory -painful or prolonged erections -restlessness, pacing, inability to keep  still -seizures -stiff muscles -suicidal thoughts or other mood changes -trouble sleeping -unusual bleeding or bruising -unusually weak or tired -vomiting Side effects that usually do not require medical attention (report to your doctor or health care professional if they continue or are bothersome): -change in appetite or weight -change in sex drive or performance -dizziness -headache -increased sweating -indigestion, nausea -tremors This list may not describe all possible side effects. Call your doctor for medical advice about side effects. You may report side effects to FDA at 1-800-FDA-1088. Where should I keep my medicine? Keep out of reach of children. Store at room temperature between 15 and 30 degrees C (59 and 86 degrees F). Throw away any unused medicine after the expiration date. NOTE: This sheet is a summary. It may not cover all possible information. If you have questions about this medicine, talk to your doctor, pharmacist, or health care provider.  2018 Elsevier/Gold Standard (2015-11-04 13:18:52)  
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
## 2017-08-05 LAB — PROLACTIN: Prolactin: 1.4 ng/mL — ABNORMAL LOW (ref 4.8–23.3)

## 2017-08-05 LAB — VITAMIN B12: Vitamin B-12: 494 pg/mL (ref 180–914)

## 2017-08-05 LAB — VITAMIN D 25 HYDROXY (VIT D DEFICIENCY, FRACTURES): Vit D, 25-Hydroxy: 22.9 ng/mL — ABNORMAL LOW (ref 30.0–100.0)

## 2017-08-12 NOTE — Progress Notes (Signed)
Pt was called and given result. Pt was strongly advised to watch sugar and fat food and drinks. Pt states she not diabatic pt was advised that vit d levels were low.    Pt was mailed a copy of labwork and pt pcp was faxed and confirmed a copy of labwork also   Pt states she has a  appt on 08-31-17.

## 2017-09-01 ENCOUNTER — Encounter: Payer: Self-pay | Admitting: Psychiatry

## 2017-09-01 ENCOUNTER — Ambulatory Visit (INDEPENDENT_AMBULATORY_CARE_PROVIDER_SITE_OTHER): Payer: Medicare Other | Admitting: Licensed Clinical Social Worker

## 2017-09-01 ENCOUNTER — Ambulatory Visit (INDEPENDENT_AMBULATORY_CARE_PROVIDER_SITE_OTHER): Payer: Medicare Other | Admitting: Psychiatry

## 2017-09-01 ENCOUNTER — Other Ambulatory Visit: Payer: Self-pay

## 2017-09-01 VITALS — BP 138/86 | HR 77 | Temp 98.4°F | Wt 162.6 lb

## 2017-09-01 DIAGNOSIS — F4310 Post-traumatic stress disorder, unspecified: Secondary | ICD-10-CM

## 2017-09-01 DIAGNOSIS — F200 Paranoid schizophrenia: Secondary | ICD-10-CM

## 2017-09-01 MED ORDER — ARIPIPRAZOLE 10 MG PO TABS
10.0000 mg | ORAL_TABLET | Freq: Every day | ORAL | 0 refills | Status: DC
Start: 2017-09-01 — End: 2020-08-07

## 2017-09-01 MED ORDER — CITALOPRAM HYDROBROMIDE 10 MG PO TABS: 5.0000 mg | ORAL_TABLET | Freq: Every day | ORAL | 0 refills | Status: DC
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
## 2017-09-30 ENCOUNTER — Ambulatory Visit (INDEPENDENT_AMBULATORY_CARE_PROVIDER_SITE_OTHER): Payer: Medicare Other | Admitting: Licensed Clinical Social Worker

## 2017-09-30 DIAGNOSIS — F200 Paranoid schizophrenia: Secondary | ICD-10-CM

## 2017-10-18 NOTE — Progress Notes (Signed)
   THERAPIST PROGRESS NOTE  Session Time: 1hour  Participation Level: Active  Behavioral Response: CasualAlertEuthymic  Type of Therapy: Individual Therapy  Treatment Goals addressed: Coping  Interventions: Supportive  Summary: Sara Mcintyre is a 56 y.o. female who presents with continued symptoms of her diagnosis. Therapist engaged Patient in discussion about her life and what is going well for her. Therapist provided support for Patient as she shared details about her life, her current stressors, mood, coping skills, her past, and her children.  Therapist prompted Patient to discuss her support system and ways that she manages her daily stress, anger, and frustrations.     Suicidal/Homicidal: No  Plan: Return again in 2 weeks.  Diagnosis: Axis I: Paranoid Schizophrenia    Axis II: No diagnosis    Marinda Elk, LCSW 09/30/17

## 2017-10-20 ENCOUNTER — Other Ambulatory Visit: Payer: Self-pay | Admitting: Psychiatry

## 2017-10-20 DIAGNOSIS — F4310 Post-traumatic stress disorder, unspecified: Secondary | ICD-10-CM

## 2017-10-27 ENCOUNTER — Ambulatory Visit: Payer: Medicare Other | Admitting: Psychiatry

## 2017-11-09 ENCOUNTER — Ambulatory Visit: Payer: Medicaid Other | Admitting: Licensed Clinical Social Worker

## 2018-03-26 ENCOUNTER — Other Ambulatory Visit: Payer: Self-pay | Admitting: Psychiatry

## 2018-03-26 DIAGNOSIS — F200 Paranoid schizophrenia: Secondary | ICD-10-CM

## 2018-05-20 ENCOUNTER — Other Ambulatory Visit: Payer: Self-pay | Admitting: Primary Care

## 2018-05-20 DIAGNOSIS — R51 Headache: Principal | ICD-10-CM

## 2018-05-20 DIAGNOSIS — R519 Headache, unspecified: Secondary | ICD-10-CM

## 2018-05-26 ENCOUNTER — Ambulatory Visit: Payer: Medicare Other

## 2018-06-02 ENCOUNTER — Ambulatory Visit
Admission: RE | Admit: 2018-06-02 | Discharge: 2018-06-02 | Disposition: A | Discharge status: Home / Self Care | Payer: Medicare Other | Source: Ambulatory Visit | Attending: Primary Care | Admitting: Primary Care

## 2018-06-02 DIAGNOSIS — R51 Headache: Secondary | ICD-10-CM | POA: Diagnosis not present

## 2018-06-02 DIAGNOSIS — R519 Headache, unspecified: Secondary | ICD-10-CM

## 2018-07-28 ENCOUNTER — Other Ambulatory Visit: Payer: Self-pay | Admitting: Registered Nurse

## 2018-07-28 DIAGNOSIS — Z1231 Encounter for screening mammogram for malignant neoplasm of breast: Secondary | ICD-10-CM

## 2018-09-08 ENCOUNTER — Other Ambulatory Visit: Payer: Self-pay | Admitting: Psychiatry

## 2018-09-08 DIAGNOSIS — F4310 Post-traumatic stress disorder, unspecified: Secondary | ICD-10-CM

## 2018-10-12 ENCOUNTER — Other Ambulatory Visit: Payer: Self-pay | Admitting: Family Medicine

## 2018-10-12 DIAGNOSIS — Z1231 Encounter for screening mammogram for malignant neoplasm of breast: Secondary | ICD-10-CM

## 2018-11-09 ENCOUNTER — Other Ambulatory Visit: Payer: Self-pay | Admitting: Psychiatry

## 2018-11-09 DIAGNOSIS — F4310 Post-traumatic stress disorder, unspecified: Secondary | ICD-10-CM

## 2018-11-30 ENCOUNTER — Other Ambulatory Visit: Payer: Self-pay | Admitting: Psychiatry

## 2018-11-30 DIAGNOSIS — F4310 Post-traumatic stress disorder, unspecified: Secondary | ICD-10-CM

## 2019-05-29 ENCOUNTER — Other Ambulatory Visit: Payer: Self-pay | Admitting: Family Medicine

## 2019-05-29 DIAGNOSIS — Z1231 Encounter for screening mammogram for malignant neoplasm of breast: Secondary | ICD-10-CM

## 2019-09-28 ENCOUNTER — Other Ambulatory Visit: Payer: Self-pay

## 2019-09-28 ENCOUNTER — Emergency Department
Admission: EM | Admit: 2019-09-28 | Discharge: 2019-09-28 | Disposition: A | Discharge status: Home / Self Care | Payer: Medicare HMO | Attending: Emergency Medicine | Admitting: Emergency Medicine

## 2019-09-28 DIAGNOSIS — B20 Human immunodeficiency virus [HIV] disease: Secondary | ICD-10-CM | POA: Diagnosis not present

## 2019-09-28 DIAGNOSIS — M25512 Pain in left shoulder: Secondary | ICD-10-CM | POA: Diagnosis not present

## 2019-09-28 DIAGNOSIS — Z87891 Personal history of nicotine dependence: Secondary | ICD-10-CM | POA: Diagnosis not present

## 2019-09-28 DIAGNOSIS — Z79899 Other long term (current) drug therapy: Secondary | ICD-10-CM | POA: Diagnosis not present

## 2019-09-28 MED ORDER — DEXAMETHASONE SODIUM PHOSPHATE 10 MG/ML IJ SOLN
10.0000 mg | Freq: Once | INTRAMUSCULAR | Status: DC
  Filled 2019-09-28: qty 1

## 2019-09-28 MED ORDER — DEXAMETHASONE SODIUM PHOSPHATE 10 MG/ML IJ SOLN
10.0000 mg | Freq: Once | INTRAMUSCULAR | Status: AC
  Administered 2019-09-28: 10 mg via INTRAMUSCULAR

## 2019-09-28 NOTE — ED Provider Notes (Signed)
Emergency Department Provider Note  ____________________________________________  Time seen: Approximately 6:20 PM  I have reviewed the triage vital signs and the nursing notes.   HISTORY  Chief Complaint Shoulder Pain   Historian Patient     HPI Sara Mcintyre is a 59 y.o. female presents to the emergency department requesting a cortisone injection of the left shoulder.  Patient states that she sought care at [facility] and has received a similar injection.  No new falls or mechanisms of trauma.   Past Medical History:  Diagnosis Date  . Abnormal vaginal bleeding   . Arthritis   . Asthma   . Bipolar disorder (HCC)   . Depression   . GERD (gastroesophageal reflux disease)   . Hemorrhoids   . HIV (human immunodeficiency virus infection) (HCC)   . Hyperlipidemia   . Low back pain   . Schizophrenia (HCC)   . Seasonal allergies      Immunizations up to date:  Yes.     Past Medical History:  Diagnosis Date  . Abnormal vaginal bleeding   . Arthritis   . Asthma   . Bipolar disorder (HCC)   . Depression   . GERD (gastroesophageal reflux disease)   . Hemorrhoids   . HIV (human immunodeficiency virus infection) (HCC)   . Hyperlipidemia   . Low back pain   . Schizophrenia (HCC)   . Seasonal allergies     Patient Active Problem List   Diagnosis Date Noted  . Arthritis 06/25/2017  . EXTERNAL HEMORRHOIDS WITHOUT MENTION COMP 06/05/2010  . HYPERLIPIDEMIA 05/21/2010  . ASTHMA 05/21/2010  . GERD 05/21/2010  . PNEUMONIA, HX OF 05/21/2010  . HIV INFECTION 05/06/2010  . PAP SMEAR, ABNORMAL, ASCUS 05/06/2010    Past Surgical History:  Procedure Laterality Date  . ABDOMINAL HYSTERECTOMY    . COLONOSCOPY WITH PROPOFOL N/A 07/20/2016   Procedure: COLONOSCOPY WITH PROPOFOL;  Surgeon: Christena Deem, MD;  Location: [facility] ENDOSCOPY;  Service: Endoscopy;  Laterality: N/A;    Prior to Admission medications   Medication Sig Start Date End Date Taking? Authorizing  Provider  ARIPiprazole (ABILIFY) 10 MG tablet Take 1 tablet (10 mg total) by mouth daily. 09/01/17   Jomarie Longs, MD  BIKTARVY 07-622-63 MG TABS tablet  06/25/17   [provider]  Cholecalciferol (VITAMIN D3) 5000 units CAPS Take by mouth. 03/18/11   [provider]  citalopram (CELEXA) 10 MG tablet TAKE ONE-HALF TABLET BY  MOUTH DAILY 09/08/18   Jomarie Longs, MD  cyclobenzaprine (FLEXERIL) 10 MG tablet  08/06/17   [provider]  emtricitabine-tenofovir (TRUVADA) 200-300 MG tablet Take 1 tablet by mouth daily.    [provider]  fexofenadine (ALLEGRA) 180 MG tablet Take 180 mg by mouth daily.    [provider]  ibuprofen (ADVIL,MOTRIN) 200 MG tablet Take 400 mg by mouth every 6 (six) hours as needed for pain.    [provider]  losartan (COZAAR) 25 MG tablet  08/03/17   [provider]  meloxicam (MOBIC) 15 MG tablet Take 1 tablet (15 mg total) by mouth daily. Take 1 daily with food. 11/15/12   Arthor Captain, PA-C  Multiple Vitamin (MULTIVITAMIN WITH MINERALS) TABS Take 1 tablet by mouth daily.    [provider]  raltegravir (ISENTRESS) 400 MG tablet Take 400 mg by mouth 2 (two) times daily.    [provider]  tiZANidine (ZANAFLEX) 4 MG tablet Take 4 mg by mouth every 6 (six) hours as needed for muscle  spasms.    [provider]    Allergies Patient has no known allergies.  Family History  Problem Relation Age of Onset  . Depression Brother   . Anxiety disorder Brother     Social History Social History   Tobacco Use  . Smoking status: Former Smoker    Types: Cigarettes    Quit date: 06/25/1977    Years since quitting: 42.2  . Smokeless tobacco: Never Used  Substance Use Topics  . Alcohol use: Yes    Alcohol/week: 4.0 - 7.0 standard drinks    Types: 2 Standard drinks or equivalent, 1 - 4 Cans of beer, 1 Glasses of wine per week  . Drug use: No     Review of Systems   Constitutional: No fever/chills Eyes:  No discharge ENT: No upper respiratory complaints. Respiratory: no cough. No SOB/ use of accessory muscles to breath Gastrointestinal:   No nausea, no vomiting.  No diarrhea.  No constipation. Musculoskeletal: Patient has left shoulder pain.  Skin: Negative for rash, abrasions, lacerations, ecchymosis.    ____________________________________________   PHYSICAL EXAM:  VITAL SIGNS: ED Triage Vitals  Enc Vitals Group     BP 09/28/19 1720 (!) 148/84     Pulse Rate 09/28/19 1720 76     Resp 09/28/19 1720 18     Temp 09/28/19 1720 98.6 F (37 C)     Temp Source 09/28/19 1720 Oral     SpO2 09/28/19 1720 99 %     Weight 09/28/19 1721 154 lb (69.9 kg)     Height 09/28/19 1721 5\' 2"  (1.575 m)     Head Circumference --      Peak Flow --      Pain Score 09/28/19 1720 7     Pain Loc --      Pain Edu? --      Excl. in [place]? --      Constitutional: Alert and oriented. Well appearing and in no acute distress. Eyes: Conjunctivae are normal. PERRL. EOMI. Head: Atraumatic. Cardiovascular: Normal rate, regular rhythm. Normal S1 and S2.  Good peripheral circulation. Respiratory: Normal respiratory effort without tachypnea or retractions. Lungs CTAB. Good air entry to the bases with no decreased or absent breath sounds Gastrointestinal: Bowel sounds x 4 quadrants. Soft and nontender to palpation. No guarding or rigidity. No distention. Musculoskeletal: Full range of motion to all extremities. No obvious deformities noted Neurologic:  Normal for age. No gross focal neurologic deficits are appreciated.  Skin:  Skin is warm, dry and intact. No rash noted. Psychiatric: Mood and affect are normal for age. Speech and behavior are normal.   ____________________________________________   LABS (all labs ordered are listed, but only abnormal results are displayed)  Labs Reviewed - No data to  display ____________________________________________  EKG   ____________________________________________  RADIOLOGY   No results found.  ____________________________________________    PROCEDURES  Procedure(s) performed:     Procedures     Medications  dexamethasone (DECADRON) injection 10 mg (has no administration in time range)     ____________________________________________   INITIAL IMPRESSION / ASSESSMENT AND PLAN / ED COURSE  Pertinent labs & imaging results that were available during my care of the patient were reviewed by me and considered in my medical decision making (see chart for details).      Assessment and plan Shoulder pain 59 year old female presents to the emergency department for cortisone injection in her left shoulder.  Explained to patient that we do not routinely administer  cortisone injections.  Into shoulder joints.  Offered patient an injection of Decadron and patient accepted until she can see her orthopedist for intended shoulder injection.  Return precautions were given to return with new or worsening symptoms.    ____________________________________________  FINAL CLINICAL IMPRESSION(S) / ED DIAGNOSES  Final diagnoses:  Acute pain of left shoulder      NEW MEDICATIONS STARTED DURING THIS VISIT:  ED Discharge Orders    None          This chart was dictated using voice recognition software/Dragon. Despite best efforts to proofread, errors can occur which can change the meaning. Any change was purely unintentional.     Gasper Lloyd 09/28/19 Janit Pagan, MD 09/28/19 1950

## 2019-09-28 NOTE — ED Triage Notes (Signed)
Reports chronic left shoulder pain, was referred to spine specialist for left shoulder pain. Pt had cortisone shot to right side X 1 week ago. Requesting left shoulder cortisone shot.
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
## 2019-09-30 ENCOUNTER — Emergency Department: Payer: Medicare HMO

## 2019-09-30 ENCOUNTER — Encounter: Payer: Self-pay | Admitting: Emergency Medicine

## 2019-09-30 ENCOUNTER — Other Ambulatory Visit: Payer: Self-pay

## 2019-09-30 ENCOUNTER — Emergency Department
Admission: EM | Admit: 2019-09-30 | Discharge: 2019-10-01 | Disposition: A | Discharge status: Home / Self Care | Payer: Medicare HMO | Attending: Emergency Medicine | Admitting: Emergency Medicine

## 2019-09-30 DIAGNOSIS — Y92410 Unspecified street and highway as the place of occurrence of the external cause: Secondary | ICD-10-CM | POA: Diagnosis not present

## 2019-09-30 DIAGNOSIS — Z23 Encounter for immunization: Secondary | ICD-10-CM | POA: Insufficient documentation

## 2019-09-30 DIAGNOSIS — S72011A Unspecified intracapsular fracture of right femur, initial encounter for closed fracture: Secondary | ICD-10-CM | POA: Diagnosis not present

## 2019-09-30 DIAGNOSIS — Z79899 Other long term (current) drug therapy: Secondary | ICD-10-CM | POA: Insufficient documentation

## 2019-09-30 DIAGNOSIS — J45909 Unspecified asthma, uncomplicated: Secondary | ICD-10-CM | POA: Diagnosis not present

## 2019-09-30 DIAGNOSIS — S5011XA Contusion of right forearm, initial encounter: Secondary | ICD-10-CM | POA: Diagnosis not present

## 2019-09-30 DIAGNOSIS — R079 Chest pain, unspecified: Secondary | ICD-10-CM | POA: Diagnosis not present

## 2019-09-30 DIAGNOSIS — Z21 Asymptomatic human immunodeficiency virus [HIV] infection status: Secondary | ICD-10-CM | POA: Insufficient documentation

## 2019-09-30 DIAGNOSIS — Y939 Activity, unspecified: Secondary | ICD-10-CM | POA: Diagnosis not present

## 2019-09-30 DIAGNOSIS — S59911A Unspecified injury of right forearm, initial encounter: Secondary | ICD-10-CM | POA: Diagnosis present

## 2019-09-30 DIAGNOSIS — Y999 Unspecified external cause status: Secondary | ICD-10-CM | POA: Diagnosis not present

## 2019-09-30 DIAGNOSIS — R55 Syncope and collapse: Secondary | ICD-10-CM | POA: Insufficient documentation

## 2019-09-30 DIAGNOSIS — Z87891 Personal history of nicotine dependence: Secondary | ICD-10-CM | POA: Insufficient documentation

## 2019-09-30 LAB — BASIC METABOLIC PANEL
Anion gap: 9 (ref 5–15)
BUN: 23 mg/dL — ABNORMAL HIGH (ref 6–20)
CO2: 25 mmol/L (ref 22–32)
Calcium: 8.9 mg/dL (ref 8.9–10.3)
Chloride: 107 mmol/L (ref 98–111)
Creatinine, Ser: 0.88 mg/dL (ref 0.44–1.00)
GFR calc Af Amer: >60 mL/min (ref >60)
GFR calc non Af Amer: >60 mL/min (ref >60)
Glucose, Bld: 146 mg/dL — ABNORMAL HIGH (ref 70–99)
Potassium: 3 mmol/L — ABNORMAL LOW (ref 3.5–5.1)
Sodium: 141 mmol/L (ref 135–145)

## 2019-09-30 LAB — CBC
HCT: 34.9 % — ABNORMAL LOW (ref 36.0–46.0)
Hemoglobin: 11.5 g/dL — ABNORMAL LOW (ref 12.0–15.0)
MCH: 28.6 pg (ref 26.0–34.0)
MCHC: 33 g/dL (ref 30.0–36.0)
MCV: 86.8 fL (ref 80.0–100.0)
Platelets: 329 10*3/uL (ref 150–400)
RBC: 4.02 MIL/uL (ref 3.87–5.11)
RDW: 14.3 % (ref 11.5–15.5)
WBC: 7.8 10*3/uL (ref 4.0–10.5)
nRBC: 0 % (ref 0.0–0.2)

## 2019-09-30 LAB — GLUCOSE, CAPILLARY: Glucose-Capillary: 124 mg/dL — ABNORMAL HIGH (ref 70–99)

## 2019-09-30 MED ORDER — TETANUS-DIPHTH-ACELL PERTUSSIS 5-2.5-18.5 LF-MCG/0.5 IM SUSP
0.5000 mL | Freq: Once | INTRAMUSCULAR | Status: AC
  Administered 2019-09-30: 23:00:00 0.5 mL via INTRAMUSCULAR
  Filled 2019-09-30: qty 0.5

## 2019-09-30 MED ORDER — KETOROLAC TROMETHAMINE 60 MG/2ML IM SOLN
15.0000 mg | Freq: Once | INTRAMUSCULAR | Status: AC
  Administered 2019-09-30: 15 mg via INTRAMUSCULAR
  Filled 2019-09-30: qty 2
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
## 2019-09-30 NOTE — ED Notes (Signed)
RED and BLUE top tube sent to lab with save labels.
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
## 2019-09-30 NOTE — ED Notes (Signed)
Urine sent to lab 
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
## 2019-09-30 NOTE — ED Notes (Signed)
Pt in xray
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
## 2019-10-01 MED ORDER — CYCLOBENZAPRINE HCL 10 MG PO TABS: 10.0000 mg | ORAL_TABLET | Freq: Three times a day (TID) | ORAL | 0 refills | Status: DC | PRN

## 2019-12-05 ENCOUNTER — Other Ambulatory Visit: Payer: Self-pay | Admitting: Family Medicine

## 2019-12-05 DIAGNOSIS — Z1231 Encounter for screening mammogram for malignant neoplasm of breast: Secondary | ICD-10-CM

## 2019-12-10 ENCOUNTER — Other Ambulatory Visit: Payer: Self-pay

## 2019-12-10 ENCOUNTER — Emergency Department: Payer: Medicare HMO

## 2019-12-10 ENCOUNTER — Emergency Department
Admission: EM | Admit: 2019-12-10 | Discharge: 2019-12-10 | Disposition: A | Discharge status: Home / Self Care | Payer: Medicare HMO | Attending: Emergency Medicine | Admitting: Emergency Medicine

## 2019-12-10 DIAGNOSIS — M7989 Other specified soft tissue disorders: Secondary | ICD-10-CM

## 2019-12-10 DIAGNOSIS — R2243 Localized swelling, mass and lump, lower limb, bilateral: Secondary | ICD-10-CM | POA: Diagnosis not present

## 2019-12-10 DIAGNOSIS — Z87891 Personal history of nicotine dependence: Secondary | ICD-10-CM | POA: Diagnosis not present

## 2019-12-10 DIAGNOSIS — J45909 Unspecified asthma, uncomplicated: Secondary | ICD-10-CM | POA: Insufficient documentation

## 2019-12-10 DIAGNOSIS — Z79899 Other long term (current) drug therapy: Secondary | ICD-10-CM | POA: Insufficient documentation

## 2019-12-10 LAB — CBC
HCT: 35.1 % — ABNORMAL LOW (ref 36.0–46.0)
Hemoglobin: 11.8 g/dL — ABNORMAL LOW (ref 12.0–15.0)
MCH: 29.1 pg (ref 26.0–34.0)
MCHC: 33.6 g/dL (ref 30.0–36.0)
MCV: 86.7 fL (ref 80.0–100.0)
Platelets: 327 10*3/uL (ref 150–400)
RBC: 4.05 MIL/uL (ref 3.87–5.11)
RDW: 14.1 % (ref 11.5–15.5)
WBC: 5.3 10*3/uL (ref 4.0–10.5)
nRBC: 0 % (ref 0.0–0.2)

## 2019-12-10 LAB — COMPREHENSIVE METABOLIC PANEL
ALT: 31 U/L (ref 0–44)
AST: 24 U/L (ref 15–41)
Albumin: 4 g/dL (ref 3.5–5.0)
Alkaline Phosphatase: 65 U/L (ref 38–126)
Anion gap: 9 (ref 5–15)
BUN: 18 mg/dL (ref 6–20)
CO2: 26 mmol/L (ref 22–32)
Calcium: 8.7 mg/dL — ABNORMAL LOW (ref 8.9–10.3)
Chloride: 105 mmol/L (ref 98–111)
Creatinine, Ser: 0.88 mg/dL (ref 0.44–1.00)
GFR calc Af Amer: >60 mL/min (ref >60)
GFR calc non Af Amer: >60 mL/min (ref >60)
Glucose, Bld: 121 mg/dL — ABNORMAL HIGH (ref 70–99)
Potassium: 3.5 mmol/L (ref 3.5–5.1)
Sodium: 140 mmol/L (ref 135–145)
Total Bilirubin: 0.7 mg/dL (ref 0.3–1.2)
Total Protein: 7.3 g/dL (ref 6.5–8.1)

## 2019-12-10 LAB — BRAIN NATRIURETIC PEPTIDE: B Natriuretic Peptide: 17.4 pg/mL (ref 0.0–100.0)

## 2019-12-10 NOTE — ED Provider Notes (Signed)
[facility] Emergency Department Provider Note  ____________________________________________  Time seen: Approximately 2:36 PM  I have reviewed the triage vital signs and the nursing notes.   HISTORY  Chief Complaint Foot Swelling    HPI Sara Mcintyre is a 59 y.o. female that presents to the emergency department for evaluation of bilateral foot swelling for several days.  Patient states that her feet have been swollen when she gets off work.  She has 2 jobs where she is on her feet all day.  She has used Ace bandages her to her feet with some improvement.  She was supposed to work today but did not feel like she could go.  Past Medical History:  Diagnosis Date  . Abnormal vaginal bleeding   . Arthritis   . Asthma   . Bipolar disorder (HCC)   . Depression   . GERD (gastroesophageal reflux disease)   . Hemorrhoids   . HIV (human immunodeficiency virus infection) (HCC)   . Hyperlipidemia   . Low back pain   . Schizophrenia (HCC)   . Seasonal allergies     Patient Active Problem List   Diagnosis Date Noted  . Arthritis 06/25/2017  . EXTERNAL HEMORRHOIDS WITHOUT MENTION COMP 06/05/2010  . HYPERLIPIDEMIA 05/21/2010  . ASTHMA 05/21/2010  . GERD 05/21/2010  . PNEUMONIA, HX OF 05/21/2010  . HIV INFECTION 05/06/2010  . PAP SMEAR, ABNORMAL, ASCUS 05/06/2010    Past Surgical History:  Procedure Laterality Date  . ABDOMINAL HYSTERECTOMY    . COLONOSCOPY WITH PROPOFOL N/A 07/20/2016   Procedure: COLONOSCOPY WITH PROPOFOL;  Surgeon: Christena Deem, MD;  Location: [facility] ENDOSCOPY;  Service: Endoscopy;  Laterality: N/A;    Prior to Admission medications   Medication Sig Start Date End Date Taking? Authorizing Provider  ARIPiprazole (ABILIFY) 10 MG tablet Take 1 tablet (10 mg total) by mouth daily. 09/01/17   Jomarie Longs, MD  BIKTARVY [id number] MG TABS tablet  06/25/17   [provider]  Cholecalciferol (VITAMIN D3) 5000 units CAPS Take  by mouth. 03/18/11   [provider]  citalopram (CELEXA) 10 MG tablet TAKE ONE-HALF TABLET BY  MOUTH DAILY 09/08/18   Jomarie Longs, MD  cyclobenzaprine (FLEXERIL) 10 MG tablet  08/06/17   [provider]  cyclobenzaprine (FLEXERIL) 10 MG tablet Take 1 tablet (10 mg total) by mouth 3 (three) times daily as needed. 10/01/19   Darci Current, MD  emtricitabine-tenofovir (TRUVADA) 200-300 MG tablet Take 1 tablet by mouth daily.    [provider]  fexofenadine (ALLEGRA) 180 MG tablet Take 180 mg by mouth daily.    [provider]  ibuprofen (ADVIL,MOTRIN) 200 MG tablet Take 400 mg by mouth every 6 (six) hours as needed for pain.    [provider]  losartan (COZAAR) 25 MG tablet  08/03/17   [provider]  meloxicam (MOBIC) 15 MG tablet Take 1 tablet (15 mg total) by mouth daily. Take 1 daily with food. 11/15/12   Arthor Captain, PA-C  Multiple Vitamin (MULTIVITAMIN WITH MINERALS) TABS Take 1 tablet by mouth daily.    [provider]  raltegravir (ISENTRESS) 400 MG tablet Take 400 mg by mouth 2 (two) times daily.    [provider]  tiZANidine (ZANAFLEX) 4 MG tablet Take 4 mg by mouth every 6 (six) hours as needed for muscle spasms.    [provider]    Allergies Patient has no known allergies.  Family History  Problem Relation Age of Onset  .  Depression Brother   . Anxiety disorder Brother     Social History Social History   Tobacco Use  . Smoking status: Former Smoker    Types: Cigarettes    Quit date: 06/25/1977    Years since quitting: 42.4  . Smokeless tobacco: Never Used  Vaping Use  . Vaping Use: Never used  Substance Use Topics  . Alcohol use: Yes    Alcohol/week: 4.0 - 7.0 standard drinks    Types: 1 Glasses of wine, 1 - 4 Cans of beer, 2 Standard drinks or equivalent per week    Comment: occ  . Drug use: No     Review of Systems  Constitutional: No fever/chills Cardiovascular: No  chest pain. Respiratory: No cough. No SOB. Gastrointestinal: No abdominal pain.  No nausea, no vomiting.  Musculoskeletal: Positive for foot pain and swelling. Skin: Negative for rash, abrasions, lacerations, ecchymosis. Neurological: Negative for headaches, numbness or tingling   ____________________________________________   PHYSICAL EXAM:  VITAL SIGNS: ED Triage Vitals [12/10/19 1048]  Enc Vitals Group     BP (!) 156/94     Pulse Rate 72     Resp 18     Temp 98.4 F (36.9 C)     Temp src      SpO2 98 %     Weight 163 lb (73.9 kg)     Height 5\' 2"  (1.575 m)     Head Circumference      Peak Flow      Pain Score 0     Pain Loc      Pain Edu?      Excl. in GC?      Constitutional: Alert and oriented. Well appearing and in no acute distress. Eyes: Conjunctivae are normal. PERRL. EOMI. Head: Atraumatic. ENT:      Ears:      Nose: No congestion/rhinnorhea.      Mouth/Throat: Mucous membranes are moist.  Neck: No stridor.  Cardiovascular: Normal rate, regular rhythm.  Good peripheral circulation. Respiratory: Normal respiratory effort without tachypnea or retractions. Lungs CTAB. Good air entry to the bases with no decreased or absent breath sounds. Musculoskeletal: Full range of motion to all extremities. No gross deformities appreciated.  Full range of motion of bilateral ankles and feet.  No obvious swelling. Neurologic:  Normal speech and language. No gross focal neurologic deficits are appreciated.  Skin:  Skin is warm, dry and intact. No rash noted. Psychiatric: Mood and affect are normal. Speech and behavior are normal. Patient exhibits appropriate insight and judgement.   ____________________________________________   LABS (all labs ordered are listed, but only abnormal results are displayed)  Labs Reviewed  CBC - Abnormal; Notable for the following components:      Result Value   Hemoglobin 11.8 (*)    HCT 35.1 (*)    All other components within normal  limits  COMPREHENSIVE METABOLIC PANEL - Abnormal; Notable for the following components:   Glucose, Bld 121 (*)    Calcium 8.7 (*)    All other components within normal limits  BRAIN NATRIURETIC PEPTIDE   ____________________________________________  EKG   ____________________________________________  RADIOLOGY , personally viewed and evaluated these images (plain radiographs) as part of my medical decision making, as well as reviewing the written report by the radiologist.  Lexine Baton Venous Img Lower Bilateral  Result Date: 12/10/2019 CLINICAL DATA:  Bilateral lower extremity pain and edema for the past 2 days. Evaluate for DVT. EXAM: BILATERAL LOWER EXTREMITY VENOUS DOPPLER  ULTRASOUND TECHNIQUE: Gray-scale sonography with graded compression, as well as color Doppler and duplex ultrasound were performed to evaluate the lower extremity deep venous systems from the level of the common femoral vein and including the common femoral, femoral, profunda femoral, popliteal and calf veins including the posterior tibial, peroneal and gastrocnemius veins when visible. The superficial great saphenous vein was also interrogated. Spectral Doppler was utilized to evaluate flow at rest and with distal augmentation maneuvers in the common femoral, femoral and popliteal veins. COMPARISON:  None. FINDINGS: RIGHT LOWER EXTREMITY Common Femoral Vein: No evidence of thrombus. Normal compressibility, respiratory phasicity and response to augmentation. Saphenofemoral Junction: No evidence of thrombus. Normal compressibility and flow on color Doppler imaging. Profunda Femoral Vein: No evidence of thrombus. Normal compressibility and flow on color Doppler imaging. Femoral Vein: No evidence of thrombus. Normal compressibility, respiratory phasicity and response to augmentation. Popliteal Vein: No evidence of thrombus. Normal compressibility, respiratory phasicity and response to augmentation. Calf Veins: No  evidence of thrombus. Normal compressibility and flow on color Doppler imaging. Superficial Great Saphenous Vein: No evidence of thrombus. Normal compressibility. Venous Reflux:  None. Other Findings:  None. LEFT LOWER EXTREMITY Common Femoral Vein: No evidence of thrombus. Normal compressibility, respiratory phasicity and response to augmentation. Saphenofemoral Junction: No evidence of thrombus. Normal compressibility and flow on color Doppler imaging. Profunda Femoral Vein: No evidence of thrombus. Normal compressibility and flow on color Doppler imaging. Femoral Vein: No evidence of thrombus. Normal compressibility, respiratory phasicity and response to augmentation. Popliteal Vein: No evidence of thrombus. Normal compressibility, respiratory phasicity and response to augmentation. Calf Veins: No evidence of thrombus. Normal compressibility and flow on color Doppler imaging. Superficial Great Saphenous Vein: No evidence of thrombus. Normal compressibility. Venous Reflux:  None. Other Findings:  None. IMPRESSION: No evidence of DVT within either lower extremity. Electronically Signed   By: Simonne Come M.D.   On: 12/10/2019 12:22    ____________________________________________    PROCEDURES  Procedure(s) performed:    Procedures    Medications - No data to display   ____________________________________________   INITIAL IMPRESSION / ASSESSMENT AND PLAN / ED COURSE  Pertinent labs & imaging results that were available during my care of the patient were reviewed by me and considered in my medical decision making (see chart for details).  Review of the [place] CSRS was performed in accordance of the [facility] prior to dispensing any controlled drugs.   Patient presented to the emergency department for evaluation of bilateral feet pain and swelling for several days.  Vital signs and exam are reassuring.  Ultrasound negative for DVT.  Lab work largely unremarkable.  BNP within normal limits.  Ace  bandages were placed.  Symptoms are likely due to patient standing on her feet for work.  Patient is to follow up with primary as directed. Patient is given ED precautions to return to the ED for any worsening or new symptoms.  Sara Mcintyre was evaluated in Emergency Department on 12/10/2019 for the symptoms described in the history of present illness. She was evaluated in the context of the global COVID-19 pandemic, which necessitated consideration that the patient might be at risk for infection with the SARS-CoV-2 virus that causes COVID-19. Institutional protocols and algorithms that pertain to the evaluation of patients at risk for COVID-19 are in a state of rapid change based on information released by regulatory bodies including the CDC and federal and state organizations. These policies and algorithms were followed during the patient's care in the  ED.   ____________________________________________  FINAL CLINICAL IMPRESSION(S) / ED DIAGNOSES  Final diagnoses:  Foot swelling      NEW MEDICATIONS STARTED DURING THIS VISIT:  ED Discharge Orders    None          This chart was dictated using voice recognition software/Dragon. Despite best efforts to proofread, errors can occur which can change the meaning. Any change was purely unintentional.    Laban Emperor, PA-C 12/10/19 1445    Carrie Mew, MD 12/11/19 2102

## 2019-12-10 NOTE — ED Notes (Signed)
Ace bandages applied to bilateral legs

## 2019-12-10 NOTE — ED Notes (Signed)
Pt presents to the ED for R foot swelling, pt states both feet have been swelling but the R foot has been worst. Pt has the foot wrapped in ace bandage .Pt states she also states she been soaking her feet with no relief. On assessment, no swelling noted to her feet. Pulses strong bilaterally. Pt ambulatory to the room.

## 2019-12-10 NOTE — ED Triage Notes (Signed)
Pt comes via POV from home with c/o bilateral foot swelling. Pt states she noticed it yesterday. Pt states she works two jobs and is on her feet a lot.  Pt states no relief with any home remedies. Pt denies any pain or other issues.

## 2020-02-06 ENCOUNTER — Encounter: Payer: Self-pay | Admitting: Emergency Medicine

## 2020-02-06 ENCOUNTER — Other Ambulatory Visit: Payer: Self-pay

## 2020-02-06 ENCOUNTER — Emergency Department
Admission: EM | Admit: 2020-02-06 | Discharge: 2020-02-06 | Disposition: A | Discharge status: Home / Self Care | Payer: Medicare HMO | Attending: Emergency Medicine | Admitting: Emergency Medicine

## 2020-02-06 DIAGNOSIS — J029 Acute pharyngitis, unspecified: Secondary | ICD-10-CM | POA: Insufficient documentation

## 2020-02-06 DIAGNOSIS — Z79899 Other long term (current) drug therapy: Secondary | ICD-10-CM | POA: Insufficient documentation

## 2020-02-06 DIAGNOSIS — J45909 Unspecified asthma, uncomplicated: Secondary | ICD-10-CM | POA: Diagnosis not present

## 2020-02-06 DIAGNOSIS — Z21 Asymptomatic human immunodeficiency virus [HIV] infection status: Secondary | ICD-10-CM | POA: Insufficient documentation

## 2020-02-06 DIAGNOSIS — Z87891 Personal history of nicotine dependence: Secondary | ICD-10-CM | POA: Diagnosis not present

## 2020-02-06 LAB — GROUP A STREP BY PCR: Group A Strep by PCR: NOT DETECTED

## 2020-02-06 NOTE — ED Notes (Addendum)
Pt in triage eating [employer].  

## 2020-02-06 NOTE — Discharge Instructions (Signed)
Advised self quarantine pending results of COVID-19 test.  Follow discharge care instructions.

## 2020-02-06 NOTE — ED Provider Notes (Signed)
[facility] Emergency Department Provider Note   ____________________________________________   First MD Initiated Contact with Patient 02/06/20 0840     (approximate)  I have reviewed the triage vital signs and the nursing notes.   HISTORY  Chief Complaint Sore Throat    HPI Sara Mcintyre is a 59 y.o. female awakened yesterday morning with sore throat.  Patient also has nasal congestion.  Denies cough.  Denies nausea, vomiting, diarrhea.  Has taken 2 doses of the COVID-19 vaccine.  Patient believes she contracted a sore throat with grandchildren which are all symptomatic with this complaint.  Patient is able to tolerate food and fluids.          Past Medical History:  Diagnosis Date  . Abnormal vaginal bleeding   . Arthritis   . Asthma   . Bipolar disorder (HCC)   . Depression   . GERD (gastroesophageal reflux disease)   . Hemorrhoids   . HIV (human immunodeficiency virus infection) (HCC)   . Hyperlipidemia   . Low back pain   . Schizophrenia (HCC)   . Seasonal allergies     Patient Active Problem List   Diagnosis Date Noted  . Arthritis 06/25/2017  . EXTERNAL HEMORRHOIDS WITHOUT MENTION COMP 06/05/2010  . HYPERLIPIDEMIA 05/21/2010  . ASTHMA 05/21/2010  . GERD 05/21/2010  . PNEUMONIA, HX OF 05/21/2010  . HIV INFECTION 05/06/2010  . PAP SMEAR, ABNORMAL, ASCUS 05/06/2010    Past Surgical History:  Procedure Laterality Date  . ABDOMINAL HYSTERECTOMY    . COLONOSCOPY WITH PROPOFOL N/A 07/20/2016   Procedure: COLONOSCOPY WITH PROPOFOL;  Surgeon: Christena Deem, MD;  Location: [facility] ENDOSCOPY;  Service: Endoscopy;  Laterality: N/A;    Prior to Admission medications   Medication Sig Start Date End Date Taking? Authorizing Provider  ARIPiprazole (ABILIFY) 10 MG tablet Take 1 tablet (10 mg total) by mouth daily. 09/01/17   Jomarie Longs, MD  BIKTARVY [id number] MG TABS tablet  06/25/17   [provider]  Cholecalciferol  (VITAMIN D3) 5000 units CAPS Take by mouth. 03/18/11   [provider]  citalopram (CELEXA) 10 MG tablet TAKE ONE-HALF TABLET BY  MOUTH DAILY 09/08/18   Jomarie Longs, MD  cyclobenzaprine (FLEXERIL) 10 MG tablet  08/06/17   [provider]  cyclobenzaprine (FLEXERIL) 10 MG tablet Take 1 tablet (10 mg total) by mouth 3 (three) times daily as needed. 10/01/19   Darci Current, MD  emtricitabine-tenofovir (TRUVADA) 200-300 MG tablet Take 1 tablet by mouth daily.    [provider]  fexofenadine (ALLEGRA) 180 MG tablet Take 180 mg by mouth daily.    [provider]  ibuprofen (ADVIL,MOTRIN) 200 MG tablet Take 400 mg by mouth every 6 (six) hours as needed for pain.    [provider]  losartan (COZAAR) 25 MG tablet  08/03/17   [provider]  meloxicam (MOBIC) 15 MG tablet Take 1 tablet (15 mg total) by mouth daily. Take 1 daily with food. 11/15/12   Arthor Captain, PA-C  Multiple Vitamin (MULTIVITAMIN WITH MINERALS) TABS Take 1 tablet by mouth daily.    [provider]  raltegravir (ISENTRESS) 400 MG tablet Take 400 mg by mouth 2 (two) times daily.    [provider]  tiZANidine (ZANAFLEX) 4 MG tablet Take 4 mg by mouth every 6 (six) hours as needed for muscle spasms.    [provider]    Allergies Patient has no known allergies.  Family History  Problem  Relation Age of Onset  . Depression Brother   . Anxiety disorder Brother     Social History Social History   Tobacco Use  . Smoking status: Former Smoker    Types: Cigarettes    Quit date: 06/25/1977    Years since quitting: 42.6  . Smokeless tobacco: Never Used  Vaping Use  . Vaping Use: Never used  Substance Use Topics  . Alcohol use: Yes    Alcohol/week: 4.0 - 7.0 standard drinks    Types: 1 Glasses of wine, 1 - 4 Cans of beer, 2 Standard drinks or equivalent per week    Comment: occ  . Drug use: No    Review of Systems Constitutional: No  fever/chills Eyes: No visual changes. ENT: No sore throat. Cardiovascular: Denies chest pain. Respiratory: Denies shortness of breath. Gastrointestinal: No abdominal pain.  No nausea, no vomiting.  No diarrhea.  No constipation. Genitourinary: Negative for dysuria. Musculoskeletal: Negative for back pain. Skin: Negative for rash. Neurological: Negative for headaches, focal weakness or numbness. Psychiatric:  Bipolar,depression, and schizophrenia. Endocrine:  Hyperlipidemia Hematological/Lymphatic:  HIV.   ____________________________________________   PHYSICAL EXAM:  VITAL SIGNS: ED Triage Vitals  Enc Vitals Group     BP 02/06/20 0822 134/80     Pulse Rate 02/06/20 0822 72     Resp 02/06/20 0822 20     Temp 02/06/20 0822 99.1 F (37.3 C)     Temp Source 02/06/20 0822 Oral     SpO2 02/06/20 0822 100 %     Weight 02/06/20 0815 163 lb (73.9 kg)     Height 02/06/20 0815 5\' 2"  (1.575 m)     Head Circumference --      Peak Flow --      Pain Score 02/06/20 0815 0     Pain Loc --      Pain Edu? --      Excl. in GC? --    Constitutional: Alert and oriented. Well appearing and in no acute distress. Eyes: Conjunctivae are normal. PERRL. EOMI. Head: Atraumatic. Nose: No congestion/rhinnorhea. Mouth/Throat: Mucous membranes are moist.  Postnasal drainage.  Oropharynx erythematous. Neck: No stridor.  Hematological/Lymphatic/Immunilogical: No cervical lymphadenopathy. Cardiovascular: Normal rate, regular rhythm. Grossly normal heart sounds.  Good peripheral circulation. Respiratory: Normal respiratory effort.  No retractions. Lungs CTAB. Skin:  Skin is warm, dry and intact. No rash noted. ____________________________________________   LABS (all labs ordered are listed, but only abnormal results are displayed)  Labs Reviewed  GROUP A STREP BY PCR   ____________________________________________  EKG   ____________________________________________  RADIOLOGY  ED MD  interpretation:    Official radiology report(s): No results found.  ____________________________________________   PROCEDURES  Procedure(s) performed (including Critical Care):  Procedures   ____________________________________________   INITIAL IMPRESSION / ASSESSMENT AND PLAN / ED COURSE  As part of my medical decision making, I reviewed the following data within the electronic MEDICAL RECORD NUMBER     Patient presents with sore throat which she believes was contacted from her grandchildren.  Patient rapid strep test was negative.  Patient given discharge care instructions.         ____________________________________________   FINAL CLINICAL IMPRESSION(S) / ED DIAGNOSES  Final diagnoses:  Viral pharyngitis     ED Discharge Orders    None       Note:  This document was prepared using Dragon voice recognition software and may include unintentional dictation errors.    02/08/20, PA-C 02/06/20 1043  Minna Antis, MD 02/06/20 1425

## 2020-02-06 NOTE — ED Triage Notes (Signed)
Pt reports woke up yesterday with a sore throat

## 2020-02-23 ENCOUNTER — Ambulatory Visit
Admission: RE | Admit: 2020-02-23 | Discharge: 2020-02-23 | Disposition: A | Discharge status: Home / Self Care | Payer: Medicare HMO | Source: Ambulatory Visit | Attending: Family Medicine | Admitting: Family Medicine

## 2020-02-23 DIAGNOSIS — Z1231 Encounter for screening mammogram for malignant neoplasm of breast: Secondary | ICD-10-CM | POA: Insufficient documentation

## 2020-03-27 ENCOUNTER — Other Ambulatory Visit: Payer: Self-pay

## 2020-03-27 DIAGNOSIS — R29818 Other symptoms and signs involving the nervous system: Secondary | ICD-10-CM | POA: Diagnosis not present

## 2020-03-27 DIAGNOSIS — R42 Dizziness and giddiness: Secondary | ICD-10-CM | POA: Insufficient documentation

## 2020-03-27 DIAGNOSIS — Z21 Asymptomatic human immunodeficiency virus [HIV] infection status: Secondary | ICD-10-CM | POA: Diagnosis not present

## 2020-03-27 DIAGNOSIS — J45909 Unspecified asthma, uncomplicated: Secondary | ICD-10-CM | POA: Insufficient documentation

## 2020-03-27 DIAGNOSIS — Z87891 Personal history of nicotine dependence: Secondary | ICD-10-CM | POA: Diagnosis not present

## 2020-03-27 DIAGNOSIS — Z79899 Other long term (current) drug therapy: Secondary | ICD-10-CM | POA: Diagnosis not present

## 2020-03-27 LAB — BASIC METABOLIC PANEL
Anion gap: 10 (ref 5–15)
BUN: 23 mg/dL — ABNORMAL HIGH (ref 6–20)
CO2: 27 mmol/L (ref 22–32)
Calcium: 9 mg/dL (ref 8.9–10.3)
Chloride: 107 mmol/L (ref 98–111)
Creatinine, Ser: 0.97 mg/dL (ref 0.44–1.00)
GFR, Estimated: >60 mL/min (ref >60)
Glucose, Bld: 124 mg/dL — ABNORMAL HIGH (ref 70–99)
Potassium: 3.6 mmol/L (ref 3.5–5.1)
Sodium: 144 mmol/L (ref 135–145)

## 2020-03-27 LAB — GLUCOSE, CAPILLARY: Glucose-Capillary: 109 mg/dL — ABNORMAL HIGH (ref 70–99)

## 2020-03-27 LAB — CBC
HCT: 35.6 % — ABNORMAL LOW (ref 36.0–46.0)
Hemoglobin: 11.6 g/dL — ABNORMAL LOW (ref 12.0–15.0)
MCH: 29.1 pg (ref 26.0–34.0)
MCHC: 32.6 g/dL (ref 30.0–36.0)
MCV: 89.4 fL (ref 80.0–100.0)
Platelets: 271 10*3/uL (ref 150–400)
RBC: 3.98 MIL/uL (ref 3.87–5.11)
RDW: 15.2 % (ref 11.5–15.5)
WBC: 8.5 10*3/uL (ref 4.0–10.5)
nRBC: 0 % (ref 0.0–0.2)

## 2020-03-27 NOTE — ED Triage Notes (Addendum)
Pt comes POV with dizziness starting this morning. Pt states that when she stands the whole room spins. Pt has not passed out. LKW before bed last night.

## 2020-03-28 ENCOUNTER — Emergency Department: Payer: Medicare HMO

## 2020-03-28 ENCOUNTER — Emergency Department
Admission: EM | Admit: 2020-03-28 | Discharge: 2020-03-28 | Disposition: A | Discharge status: Home / Self Care | Payer: Medicare HMO | Attending: Emergency Medicine | Admitting: Emergency Medicine

## 2020-03-28 DIAGNOSIS — R42 Dizziness and giddiness: Secondary | ICD-10-CM | POA: Diagnosis not present

## 2020-03-28 LAB — URINALYSIS, COMPLETE (UACMP) WITH MICROSCOPIC
Bacteria, UA: NONE SEEN
Bilirubin Urine: NEGATIVE
Glucose, UA: NEGATIVE mg/dL
Ketones, ur: NEGATIVE mg/dL
Leukocytes,Ua: NEGATIVE
Nitrite: NEGATIVE
Protein, ur: NEGATIVE mg/dL
Specific Gravity, Urine: 1.021 (ref 1.005–1.030)
pH: 5 (ref 5.0–8.0)

## 2020-03-28 LAB — TSH: TSH: 1.932 u[IU]/mL (ref 0.350–4.500)

## 2020-03-28 LAB — TROPONIN I (HIGH SENSITIVITY): Troponin I (High Sensitivity): 3 ng/L (ref <18)

## 2020-03-28 MED ORDER — MECLIZINE HCL 25 MG PO TABS: 25.0000 mg | ORAL_TABLET | Freq: Three times a day (TID) | ORAL | 0 refills | Status: DC | PRN

## 2020-03-28 MED ORDER — GADOBUTROL 1 MMOL/ML IV SOLN
7.0000 mL | Freq: Once | INTRAVENOUS | Status: AC | PRN
  Administered 2020-03-28: 7 mL via INTRAVENOUS

## 2020-03-28 NOTE — ED Provider Notes (Signed)
[facility] Emergency Department Provider Note  ____________________________________________   First MD Initiated Contact with Patient 03/28/20 [phone]     (approximate)  I have reviewed the triage vital signs and the nursing notes.   HISTORY  Chief Complaint Dizziness    HPI Sara Mcintyre is a 59 y.o. female with below list of previous medical conditions including HIV schizophrenia bipolar disorder asthma presents to the emergency department secondary to dizziness which patient stated started this morning.  Patient states dizziness is worse with positional change and describes it as "the room spinning".  Patient has not had any syncopal episode or near syncopal episode secondary to her dizziness.  Patient denies any chest pain no shortness of breath.  Patient denies any lower extremity pain or swelling.  Patient denies any headache.  No weakness numbness or visual changes.  Patient denies any hearing loss.  Patient denies any tinnitus        Past Medical History:  Diagnosis Date  . Abnormal vaginal bleeding   . Arthritis   . Asthma   . Bipolar disorder (HCC)   . Depression   . GERD (gastroesophageal reflux disease)   . Hemorrhoids   . HIV (human immunodeficiency virus infection) (HCC)   . Hyperlipidemia   . Low back pain   . Schizophrenia (HCC)   . Seasonal allergies     Patient Active Problem List   Diagnosis Date Noted  . Arthritis 06/25/2017  . EXTERNAL HEMORRHOIDS WITHOUT MENTION COMP 06/05/2010  . HYPERLIPIDEMIA 05/21/2010  . ASTHMA 05/21/2010  . GERD 05/21/2010  . PNEUMONIA, HX OF 05/21/2010  . HIV INFECTION 05/06/2010  . PAP SMEAR, ABNORMAL, ASCUS 05/06/2010    Past Surgical History:  Procedure Laterality Date  . ABDOMINAL HYSTERECTOMY    . COLONOSCOPY WITH PROPOFOL N/A 07/20/2016   Procedure: COLONOSCOPY WITH PROPOFOL;  Surgeon: Christena Deem, MD;  Location: [facility] ENDOSCOPY;  Service: Endoscopy;  Laterality: N/A;     Prior to Admission medications   Medication Sig Start Date End Date Taking? Authorizing Provider  ARIPiprazole (ABILIFY) 10 MG tablet Take 1 tablet (10 mg total) by mouth daily. 09/01/17   Jomarie Longs, MD  BIKTARVY 30-160-10 MG TABS tablet  06/25/17   [provider]  Cholecalciferol (VITAMIN D3) 5000 units CAPS Take by mouth. 03/18/11   [provider]  citalopram (CELEXA) 10 MG tablet TAKE ONE-HALF TABLET BY  MOUTH DAILY 09/08/18   Jomarie Longs, MD  cyclobenzaprine (FLEXERIL) 10 MG tablet  08/06/17   [provider]  cyclobenzaprine (FLEXERIL) 10 MG tablet Take 1 tablet (10 mg total) by mouth 3 (three) times daily as needed. 10/01/19   Darci Current, MD  emtricitabine-tenofovir (TRUVADA) 200-300 MG tablet Take 1 tablet by mouth daily.    [provider]  fexofenadine (ALLEGRA) 180 MG tablet Take 180 mg by mouth daily.    [provider]  ibuprofen (ADVIL,MOTRIN) 200 MG tablet Take 400 mg by mouth every 6 (six) hours as needed for pain.    [provider]  losartan (COZAAR) 25 MG tablet  08/03/17   [provider]  meloxicam (MOBIC) 15 MG tablet Take 1 tablet (15 mg total) by mouth daily. Take 1 daily with food. 11/15/12   Arthor Captain, PA-C  Multiple Vitamin (MULTIVITAMIN WITH MINERALS) TABS Take 1 tablet by mouth daily.    [provider]  raltegravir (ISENTRESS) 400 MG tablet Take 400 mg by mouth 2 (two) times daily.    [provider]  tiZANidine (ZANAFLEX) 4 MG tablet Take 4 mg by mouth every 6 (six) hours as needed for muscle spasms.    [provider]    Allergies Patient has no known allergies.  Family History  Problem Relation Age of Onset  . Depression Brother   . Anxiety disorder Brother     Social History Social History   Tobacco Use  . Smoking status: Former Smoker    Types: Cigarettes    Quit date: 06/25/1977    Years since quitting: 42.7  . Smokeless tobacco: Never  Used  Vaping Use  . Vaping Use: Never used  Substance Use Topics  . Alcohol use: Yes    Alcohol/week: 4.0 - 7.0 standard drinks    Types: 1 Glasses of wine, 1 - 4 Cans of beer, 2 Standard drinks or equivalent per week    Comment: occ  . Drug use: No    Review of Systems Constitutional: No fever/chills Eyes: No visual changes. ENT: No sore throat. Cardiovascular: Denies chest pain. Respiratory: Denies shortness of breath. Gastrointestinal: No abdominal pain.  No nausea, no vomiting.  No diarrhea.  No constipation. Genitourinary: Negative for dysuria. Musculoskeletal: Negative for neck pain.  Negative for back pain. Integumentary: Negative for rash. Neurological: Negative for headaches, focal weakness or numbness.  Positive for dizziness  ____________________________________________   PHYSICAL EXAM:  VITAL SIGNS: ED Triage Vitals [03/27/20 1842]  Enc Vitals Group     BP (!) 143/78     Pulse Rate 65     Resp 18     Temp 99.3 F (37.4 C)     Temp Source Oral     SpO2 99 %     Weight 75.3 kg (166 lb)     Height 1.575 m (5\' 2" )     Head Circumference      Peak Flow      Pain Score 0     Pain Loc      Pain Edu?      Excl. in GC?     Constitutional: Alert and oriented.  Eyes: Conjunctivae are normal.  Head: Atraumatic. Mouth/Throat: Patient is wearing a mask. Neck: No stridor.  No meningeal signs.   Cardiovascular: Normal rate, regular rhythm. Good peripheral circulation. Grossly normal heart sounds. Respiratory: Normal respiratory effort.  No retractions. Gastrointestinal: Soft and nontender. No distention.  Musculoskeletal: No lower extremity tenderness nor edema. No gross deformities of extremities. Neurologic:  Normal speech and language. No gross focal neurologic deficits are appreciated.  Skin:  Skin is warm, dry and intact. Psychiatric: Mood and affect are normal. Speech and behavior are normal.  ____________________________________________   LABS (all  labs ordered are listed, but only abnormal results are displayed)  Labs Reviewed  BASIC METABOLIC PANEL - Abnormal; Notable for the following components:      Result Value   Glucose, Bld 124 (*)    BUN 23 (*)    All other components within normal limits  CBC - Abnormal; Notable for the following components:   Hemoglobin 11.6 (*)    HCT 35.6 (*)    All other components within normal limits  GLUCOSE, CAPILLARY - Abnormal; Notable for the following components:   Glucose-Capillary 109 (*)    All other components within normal limits  TSH  URINALYSIS, COMPLETE (UACMP) WITH MICROSCOPIC  CBG MONITORING, ED  POC URINE PREG, ED  TROPONIN I (HIGH SENSITIVITY)   ____________________________________________  EKG  ED ECG REPORT I, [place] Raynell Scott, the attending  physician, personally viewed and interpreted this ECG.   Date: 03/28/2020  EKG Time: 2:01 AM  Rate: 60  Rhythm: Normal sinus rhythm  Axis: Normal  Intervals: Normal  ST&T Change: None  ____________________________________________  RADIOLOGY I, [place] Bunnie Rehberg, personally viewed and evaluated these images (plain radiographs) as part of my medical decision making, as well as reviewing the written report by the radiologist.  ED MD interpretation: Normal MRI MRA of the head and neck  Official radiology report(s): MR ANGIO HEAD WO CONTRAST  Result Date: 03/28/2020 CLINICAL DATA:  Dizziness. EXAM: MR CIRCLE OF WILLIS WITHOUT CONTRAST MRA OF THE NECK WITHOUT AND WITH CONTRAST TECHNIQUE: Angiographic images of circle of Willis were obtained without intravenous contrast. Angiographic images of the neck were obtained using MRA technique without and with intravenous contrast. CONTRAST:  42mL GADAVIST GADOBUTROL 1 MMOL/ML IV SOLN COMPARISON:  None. FINDINGS: MR CIRCLE OF WILLIS FINDINGS POSTERIOR CIRCULATION: --Vertebral arteries: Normal V4 segments. --Inferior cerebellar arteries: Normal. --Basilar artery: Normal. --Superior cerebellar  arteries: Normal. --Posterior cerebral arteries: Normal. ANTERIOR CIRCULATION: --Intracranial internal carotid arteries: Normal. --Anterior cerebral arteries (ACA): Normal. Both A1 segments are present. Patent anterior communicating artery (a-comm). --Middle cerebral arteries (MCA): Normal. MRA NECK FINDINGS Normal carotid and vertebral arteries. IMPRESSION: Normal MRA of the head and neck. Electronically Signed   By: Deatra Robinson M.D.   On: 03/28/2020 02:41   MR Angiogram Neck W or Wo Contrast  Result Date: 03/28/2020 CLINICAL DATA:  Dizziness. EXAM: MR CIRCLE OF WILLIS WITHOUT CONTRAST MRA OF THE NECK WITHOUT AND WITH CONTRAST TECHNIQUE: Angiographic images of circle of Willis were obtained without intravenous contrast. Angiographic images of the neck were obtained using MRA technique without and with intravenous contrast. CONTRAST:  64mL GADAVIST GADOBUTROL 1 MMOL/ML IV SOLN COMPARISON:  None. FINDINGS: MR CIRCLE OF WILLIS FINDINGS POSTERIOR CIRCULATION: --Vertebral arteries: Normal V4 segments. --Inferior cerebellar arteries: Normal. --Basilar artery: Normal. --Superior cerebellar arteries: Normal. --Posterior cerebral arteries: Normal. ANTERIOR CIRCULATION: --Intracranial internal carotid arteries: Normal. --Anterior cerebral arteries (ACA): Normal. Both A1 segments are present. Patent anterior communicating artery (a-comm). --Middle cerebral arteries (MCA): Normal. MRA NECK FINDINGS Normal carotid and vertebral arteries. IMPRESSION: Normal MRA of the head and neck. Electronically Signed   By: Deatra Robinson M.D.   On: 03/28/2020 02:41      Procedures   ____________________________________________   INITIAL IMPRESSION / MDM / ASSESSMENT AND PLAN / ED COURSE  As part of my medical decision making, I reviewed the following data within the electronic MEDICAL RECORD NUMBER   59 year old female presented with above-stated history and physical exam secondary to vertiginous symptoms.  Differential  diagnosis including etiologies of central versus peripheral vertigo.  No focal abnormality noted on clinical exam.  MRI MRA of the head and neck negative.  Patient without any dizziness at present.  Patient be referred to ENT for further outpatient evaluation and management.  Patient prescribed meclizine as needed.  ____________________________________________  FINAL CLINICAL IMPRESSION(S) / ED DIAGNOSES  Final diagnoses:  Vertigo     MEDICATIONS GIVEN DURING THIS VISIT:  Medications  gadobutrol (GADAVIST) 1 MMOL/ML injection 7 mL (7 mLs Intravenous Contrast Given 03/28/20 0141)     ED Discharge Orders    None      *Please note:  Sara Mcintyre was evaluated in Emergency Department on 03/28/2020 for the symptoms described in the history of present illness. She was evaluated in the context of the global COVID-19 pandemic, which necessitated consideration that the patient might be at  risk for infection with the SARS-CoV-2 virus that causes COVID-19. Institutional protocols and algorithms that pertain to the evaluation of patients at risk for COVID-19 are in a state of rapid change based on information released by regulatory bodies including the CDC and federal and state organizations. These policies and algorithms were followed during the patient's care in the ED.  Some ED evaluations and interventions may be delayed as a result of limited staffing during and after the pandemic.*  Note:  This document was prepared using Dragon voice recognition software and may include unintentional dictation errors.   Darci CurrentBrown, [place] N, MD 03/28/20 [phone]

## 2020-03-28 NOTE — ED Notes (Signed)
Pt @MRI.

## 2020-03-28 NOTE — ED Notes (Signed)
MD at bedside. 

## 2020-04-10 ENCOUNTER — Ambulatory Visit: Payer: Self-pay | Admitting: General Surgery

## 2020-04-10 NOTE — H&P (Signed)
The patient is a 59 year old female who presents with a complaint of anal problems. 59-year-old female with HIV, undetectable, who presents to the office for evaluation of a positive anal Pap smear. She states that she has never had trouble with condyloma in the past. She denies any anal pain or rectal bleeding. Last colonoscopy was in 2018, the entire colon was normal.   Diagnostic Studies History (Lisa Caldwell, RMA; 04/10/2020 9:08 AM) Colonoscopy 1-5 years ago Mammogram within last year Pap Smear 1-5 years ago  Allergies (Lisa Caldwell, RMA; 04/10/2020 9:09 AM) No Known Drug Allergies [04/10/2020]: Allergies Reconciled  Medication History (Lisa Caldwell, RMA; 04/10/2020 9:10 AM) Cyclobenzaprine HCl (10MG Tablet, Oral) Active. ARIPiprazole (10MG Tablet, Oral) Active. Losartan Potassium (25MG Tablet, Oral) Active. Biktarvy (50-200-25MG Tablet, Oral) Active. Omeprazole (20MG Capsule DR, Oral) Active. Multivitamin (Oral) Active. Medications Reconciled  Social History (Lisa Caldwell, RMA; 04/10/2020 9:08 AM) Alcohol use Occasional alcohol use. Caffeine use Carbonated beverages, Coffee. No drug use Tobacco use Former smoker.  Family History (Lisa Caldwell, RMA; 04/10/2020 9:08 AM) Alcohol Abuse Brother, Father. Arthritis Brother, Daughter, Father, Mother, Sister. Diabetes Mellitus Brother, Father, Mother. Hypertension Father, Mother.  Pregnancy / Birth History (Lisa Caldwell, RMA; 04/10/2020 9:08 AM) Age at menarche 10 years. Age of menopause 46-50 Contraceptive History Contraceptive implant, Intrauterine device, Oral contraceptives. Gravida 3 Irregular periods Length (months) of breastfeeding 3-6 Maternal age 15-20 Para 3  Other Problems (Lisa Caldwell, RMA; 04/10/2020 9:08 AM) Anxiety Disorder Arthritis Asthma Back Pain Gastroesophageal Reflux Disease HIV-positive     Review of Systems (Lisa Caldwell RMA; 04/10/2020 9:08  AM) General Not Present- Appetite Loss, Chills, Fatigue, Fever, Night Sweats, Weight Gain and Weight Loss. Skin Not Present- Change in Wart/Mole, Dryness, Hives, Jaundice, New Lesions, Non-Healing Wounds, Rash and Ulcer. HEENT Present- Hearing Loss. Not Present- Earache, Hoarseness, Nose Bleed, Oral Ulcers, Ringing in the Ears, Seasonal Allergies, Sinus Pain, Sore Throat, Visual Disturbances, Wears glasses/contact lenses and Yellow Eyes. Respiratory Not Present- Bloody sputum, Chronic Cough, Difficulty Breathing, Snoring and Wheezing. Breast Not Present- Breast Mass, Breast Pain, Nipple Discharge and Skin Changes. Cardiovascular Not Present- Chest Pain, Difficulty Breathing Lying Down, Leg Cramps, Palpitations, Rapid Heart Rate, Shortness of Breath and Swelling of Extremities. Gastrointestinal Not Present- Abdominal Pain, Bloating, Bloody Stool, Change in Bowel Habits, Chronic diarrhea, Constipation, Difficulty Swallowing, Excessive gas, Gets full quickly at meals, Hemorrhoids, Indigestion, Nausea, Rectal Pain and Vomiting. Female Genitourinary Not Present- Frequency, Nocturia, Painful Urination, Pelvic Pain and Urgency. Musculoskeletal Not Present- Back Pain, Joint Pain, Joint Stiffness, Muscle Pain, Muscle Weakness and Swelling of Extremities. Neurological Not Present- Decreased Memory, Fainting, Headaches, Numbness, Seizures, Tingling, Tremor, Trouble walking and Weakness. Psychiatric Present- Anxiety. Not Present- Bipolar, Change in Sleep Pattern, Depression, Fearful and Frequent crying. Endocrine Present- New Diabetes. Not Present- Cold Intolerance, Excessive Hunger, Hair Changes, Heat Intolerance and Hot flashes. Hematology Present- HIV. Not Present- Blood Thinners, Easy Bruising, Excessive bleeding, Gland problems and Persistent Infections.  Vitals (Lisa Caldwell RMA; 04/10/2020 9:11 AM) 04/10/2020 9:11 AM Weight: 1743.38 lb Height: 62in Body Surface Area: 4.8 m Body Mass Index:  318.86 kg/m  Temp.: 97.8F  Pulse: 82 (Regular)  P.OX: 96% (Room air) BP: 126/80(Sitting, Left Arm, Standard)        Physical Exam (Symon Norwood MD; 04/10/2020 9:25 AM)  General Mental Status-Alert. General Appearance-Cooperative.  Abdomen Palpation/Percussion Palpation and Percussion of the abdomen reveal - Soft and Non Tender.  Rectal Anorectal Exam External - normal external exam. Note: mass palpated LL anal canal. Internal -   normal sphincter tone.    Assessment & Plan Romie Levee MD; 04/10/2020 9:27 AM)  ANAL LESION (K62.9) Impression: 59 year old female who presents to the office for evaluation after positive anal Pap smear. On exam today, she does have a small polyp in the left lateral anal canal. This appears to be a dysplastic lesion. It is approximately 1 cm long by 5 mm wide. I have recommended excision in the operating room with complete evaluation of her anal canal and perianal region. We will do this at her convenience. All questions were answered. Risks include bleeding, pain and recurrence.

## 2020-05-15 DIAGNOSIS — Z8616 Personal history of COVID-19: Secondary | ICD-10-CM

## 2020-05-15 HISTORY — DX: Personal history of COVID-19: Z86.16

## 2020-08-05 ENCOUNTER — Other Ambulatory Visit: Payer: Self-pay

## 2020-08-05 ENCOUNTER — Other Ambulatory Visit
Admission: RE | Admit: 2020-08-05 | Discharge: 2020-08-05 | Disposition: A | Discharge status: Home / Self Care | Payer: Medicare Other | Source: Ambulatory Visit | Attending: General Surgery | Admitting: General Surgery

## 2020-08-05 DIAGNOSIS — Z20822 Contact with and (suspected) exposure to covid-19: Secondary | ICD-10-CM | POA: Diagnosis not present

## 2020-08-05 DIAGNOSIS — Z01812 Encounter for preprocedural laboratory examination: Secondary | ICD-10-CM | POA: Insufficient documentation

## 2020-08-05 LAB — SARS CORONAVIRUS 2 (TAT 6-24 HRS): SARS Coronavirus 2: NEGATIVE

## 2020-08-07 ENCOUNTER — Encounter (HOSPITAL_BASED_OUTPATIENT_CLINIC_OR_DEPARTMENT_OTHER): Payer: Self-pay | Admitting: General Surgery

## 2020-08-07 ENCOUNTER — Other Ambulatory Visit: Payer: Self-pay

## 2020-08-07 ENCOUNTER — Ambulatory Visit: Payer: Self-pay | Admitting: General Surgery

## 2020-08-07 NOTE — H&P (View-Only) (Signed)
The patient is a 60 year old female who presents with a complaint of anal problems. 60 year old female with HIV, undetectable, who presents to the office for evaluation of a positive anal Pap smear. She states that she has never had trouble with condyloma in the past. She denies any anal pain or rectal bleeding. Last colonoscopy was in 2018, the entire colon was normal.   Diagnostic Studies History Laurette Schimke, [place]; 04/10/2020 9:08 AM) Colonoscopy 1-5 years ago Mammogram within last year Pap Smear 1-5 years ago  Allergies Laurette Schimke, RMA; 04/10/2020 9:09 AM) No Known Drug Allergies [04/10/2020]: Allergies Reconciled  Medication History Laurette Schimke, [place]; 04/10/2020 9:10 AM) Cyclobenzaprine HCl (10MG  Tablet, Oral) Active. ARIPiprazole (10MG  Tablet, Oral) Active. Losartan Potassium (25MG  Tablet, Oral) Active. Biktarvy (50-200-25MG  Tablet, Oral) Active. Omeprazole (20MG  Capsule DR, Oral) Active. Multivitamin (Oral) Active. Medications Reconciled  Social History , ; 04/10/2020 9:08 AM) Alcohol use Occasional alcohol use. Caffeine use Carbonated beverages, Coffee. No drug use Tobacco use Former smoker.  Family History , Laurette Schimke; 04/10/2020 9:08 AM) Alcohol Abuse Brother, Father. Arthritis Brother, Daughter, Father, Mother, Sister. Diabetes Mellitus Brother, Father, Mother. Hypertension Father, Mother.  Pregnancy / Birth History 04/12/2020, Laurette Schimke; 04/10/2020 9:08 AM) Age at menarche 10 years. Age of menopause 17-50 Contraceptive History Contraceptive implant, Intrauterine device, Oral contraceptives. Gravida 3 Irregular periods Length (months) of breastfeeding 3-6 Maternal age 55-20 Para 3  Other Problems [place], 04/12/2020; 04/10/2020 9:08 AM) Anxiety Disorder Arthritis Asthma Back Pain Gastroesophageal Reflux Disease HIV-positive     Review of Systems  General Not Present-  Appetite Loss, Chills, Fatigue, Fever, Night Sweats, Weight Gain and Weight Loss. Skin Not Present- Change in Wart/Mole, Dryness, Hives, Jaundice, New Lesions, Non-Healing Wounds, Rash and Ulcer. HEENT Present- Hearing Loss. Not Present- Earache, Hoarseness, Nose Bleed, Oral Ulcers, Ringing in the Ears, Seasonal Allergies, Sinus Pain, Sore Throat, Visual Disturbances, Wears glasses/contact lenses and Yellow Eyes. Respiratory Not Present- Bloody sputum, Chronic Cough, Difficulty Breathing, Snoring and Wheezing. Breast Not Present- Breast Mass, Breast Pain, Nipple Discharge and Skin Changes. Cardiovascular Not Present- Chest Pain, Difficulty Breathing Lying Down, Leg Cramps, Palpitations, Rapid Heart Rate, Shortness of Breath and Swelling of Extremities. Gastrointestinal Not Present- Abdominal Pain, Bloating, Bloody Stool, Change in Bowel Habits, Chronic diarrhea, Constipation, Difficulty Swallowing, Excessive gas, Gets full quickly at meals, Hemorrhoids, Indigestion, Nausea, Rectal Pain and Vomiting. Female Genitourinary Not Present- Frequency, Nocturia, Painful Urination, Pelvic Pain and Urgency. Musculoskeletal Not Present- Back Pain, Joint Pain, Joint Stiffness, Muscle Pain, Muscle Weakness and Swelling of Extremities. Neurological Not Present- Decreased Memory, Fainting, Headaches, Numbness, Seizures, Tingling, Tremor, Trouble walking and Weakness. Psychiatric Present- Anxiety. Not Present- Bipolar, Change in Sleep Pattern, Depression, Fearful and Frequent crying. Endocrine Present- New Diabetes. Not Present- Cold Intolerance, Excessive Hunger, Hair Changes, Heat Intolerance and Hot flashes. Hematology Present- HIV. Not Present- Blood Thinners, Easy Bruising, Excessive bleeding, Gland problems and Persistent Infections.  Vitals  Weight: 1743.38 lb Height: 62in Body Surface Area: 4.8 m Body Mass Index: 318.86 kg/m  Temp.: 97.17F  Pulse: 82 (Regular)  P.OX: 96% (Room air) BP:  126/80(Sitting, Left Arm, Standard)        Physical Exam   General Mental Status-Alert. General Appearance-Cooperative.  Abdomen Palpation/Percussion Palpation and Percussion of the abdomen reveal - Soft and Non Tender.  Rectal Anorectal Exam External - normal external exam. Note: mass palpated LL anal canal. Internal - normal sphincter tone.    Assessment & Plan   ANAL LESION (K62.9) Impression: 60 year old female  who presents to the office for evaluation after positive anal Pap smear. On exam today, she does have a small polyp in the left lateral anal canal. This appears to be a dysplastic lesion. It is approximately 1 cm long by 5 mm wide. I have recommended excision in the operating room with complete evaluation of her anal canal and perianal region. We will do this at her convenience. All questions were answered. Risks include bleeding, pain and recurrence.

## 2020-08-07 NOTE — H&P (Signed)
The patient is a 60 year old female who presents with a complaint of anal problems. 60 year old female with HIV, undetectable, who presents to the office for evaluation of a positive anal Pap smear. She states that she has never had trouble with condyloma in the past. She denies any anal pain or rectal bleeding. Last colonoscopy was in 2018, the entire colon was normal.   Diagnostic Studies History Laurette Schimke, [place]; 04/10/2020 9:08 AM) Colonoscopy 1-5 years ago Mammogram within last year Pap Smear 1-5 years ago  Allergies Laurette Schimke, RMA; 04/10/2020 9:09 AM) No Known Drug Allergies [04/10/2020]: Allergies Reconciled  Medication History Laurette Schimke, [place]; 04/10/2020 9:10 AM) Cyclobenzaprine HCl (10MG  Tablet, Oral) Active. ARIPiprazole (10MG  Tablet, Oral) Active. Losartan Potassium (25MG  Tablet, Oral) Active. Biktarvy (50-200-25MG  Tablet, Oral) Active. Omeprazole (20MG  Capsule DR, Oral) Active. Multivitamin (Oral) Active. Medications Reconciled  Social History , ; 04/10/2020 9:08 AM) Alcohol use Occasional alcohol use. Caffeine use Carbonated beverages, Coffee. No drug use Tobacco use Former smoker.  Family History , Laurette Schimke; 04/10/2020 9:08 AM) Alcohol Abuse Brother, Father. Arthritis Brother, Daughter, Father, Mother, Sister. Diabetes Mellitus Brother, Father, Mother. Hypertension Father, Mother.  Pregnancy / Birth History 04/12/2020, Laurette Schimke; 04/10/2020 9:08 AM) Age at menarche 10 years. Age of menopause 16-50 Contraceptive History Contraceptive implant, Intrauterine device, Oral contraceptives. Gravida 3 Irregular periods Length (months) of breastfeeding 3-6 Maternal age 55-20 Para 3  Other Problems [place], 04/12/2020; 04/10/2020 9:08 AM) Anxiety Disorder Arthritis Asthma Back Pain Gastroesophageal Reflux Disease HIV-positive     Review of Systems  General Not Present-  Appetite Loss, Chills, Fatigue, Fever, Night Sweats, Weight Gain and Weight Loss. Skin Not Present- Change in Wart/Mole, Dryness, Hives, Jaundice, New Lesions, Non-Healing Wounds, Rash and Ulcer. HEENT Present- Hearing Loss. Not Present- Earache, Hoarseness, Nose Bleed, Oral Ulcers, Ringing in the Ears, Seasonal Allergies, Sinus Pain, Sore Throat, Visual Disturbances, Wears glasses/contact lenses and Yellow Eyes. Respiratory Not Present- Bloody sputum, Chronic Cough, Difficulty Breathing, Snoring and Wheezing. Breast Not Present- Breast Mass, Breast Pain, Nipple Discharge and Skin Changes. Cardiovascular Not Present- Chest Pain, Difficulty Breathing Lying Down, Leg Cramps, Palpitations, Rapid Heart Rate, Shortness of Breath and Swelling of Extremities. Gastrointestinal Not Present- Abdominal Pain, Bloating, Bloody Stool, Change in Bowel Habits, Chronic diarrhea, Constipation, Difficulty Swallowing, Excessive gas, Gets full quickly at meals, Hemorrhoids, Indigestion, Nausea, Rectal Pain and Vomiting. Female Genitourinary Not Present- Frequency, Nocturia, Painful Urination, Pelvic Pain and Urgency. Musculoskeletal Not Present- Back Pain, Joint Pain, Joint Stiffness, Muscle Pain, Muscle Weakness and Swelling of Extremities. Neurological Not Present- Decreased Memory, Fainting, Headaches, Numbness, Seizures, Tingling, Tremor, Trouble walking and Weakness. Psychiatric Present- Anxiety. Not Present- Bipolar, Change in Sleep Pattern, Depression, Fearful and Frequent crying. Endocrine Present- New Diabetes. Not Present- Cold Intolerance, Excessive Hunger, Hair Changes, Heat Intolerance and Hot flashes. Hematology Present- HIV. Not Present- Blood Thinners, Easy Bruising, Excessive bleeding, Gland problems and Persistent Infections.  Vitals  Weight: 1743.38 lb Height: 62in Body Surface Area: 4.8 m Body Mass Index: 318.86 kg/m  Temp.: 97.17F  Pulse: 82 (Regular)  P.OX: 96% (Room air) BP:  126/80(Sitting, Left Arm, Standard)        Physical Exam   General Mental Status-Alert. General Appearance-Cooperative.  Abdomen Palpation/Percussion Palpation and Percussion of the abdomen reveal - Soft and Non Tender.  Rectal Anorectal Exam External - normal external exam. Note: mass palpated LL anal canal. Internal - normal sphincter tone.    Assessment & Plan   ANAL LESION (K62.9) Impression: 60 year old female  who presents to the office for evaluation after positive anal Pap smear. On exam today, she does have a small polyp in the left lateral anal canal. This appears to be a dysplastic lesion. It is approximately 1 cm long by 5 mm wide. I have recommended excision in the operating room with complete evaluation of her anal canal and perianal region. We will do this at her convenience. All questions were answered. Risks include bleeding, pain and recurrence. 

## 2020-08-07 NOTE — Progress Notes (Signed)
Spoke w/ via phone for pre-op interview---pt Lab needs dos----I stat               Lab results------lov neuro 11-10-2019 epic dr Malvin Johns (pt states no seizure like activity since may 2021) COVID test ------08-05-2020 negative epic Arrive at -------630 am 08-08-2020 NPO after MN NO Solid Food.  Clear liquids from MN until---530 am Medications to take morning of surgery -----flovent proair inhaler prn/bring inhaler Diabetic medication -----n/a diet controlled Patient Special Instructions -----none Pre-Op special Istructions -----none Patient verbalized understanding of instructions that were given at this phone interview. Patient denies shortness of breath, chest pain, fever, cough at this phone interview.

## 2020-08-08 ENCOUNTER — Encounter (HOSPITAL_BASED_OUTPATIENT_CLINIC_OR_DEPARTMENT_OTHER): Payer: Self-pay | Admitting: General Surgery

## 2020-08-08 ENCOUNTER — Encounter (HOSPITAL_BASED_OUTPATIENT_CLINIC_OR_DEPARTMENT_OTHER)
Admission: RE | Disposition: A | Discharge status: Home / Self Care | Payer: Self-pay | Source: Ambulatory Visit | Attending: General Surgery

## 2020-08-08 ENCOUNTER — Ambulatory Visit (HOSPITAL_BASED_OUTPATIENT_CLINIC_OR_DEPARTMENT_OTHER)
Admission: RE | Admit: 2020-08-08 | Discharge: 2020-08-08 | Disposition: A | Discharge status: Home / Self Care | Payer: Medicare Other | Source: Ambulatory Visit | Attending: General Surgery | Admitting: General Surgery

## 2020-08-08 ENCOUNTER — Ambulatory Visit (HOSPITAL_BASED_OUTPATIENT_CLINIC_OR_DEPARTMENT_OTHER): Payer: Medicare Other | Admitting: Anesthesiology

## 2020-08-08 ENCOUNTER — Other Ambulatory Visit: Payer: Self-pay

## 2020-08-08 DIAGNOSIS — Z8261 Family history of arthritis: Secondary | ICD-10-CM | POA: Diagnosis not present

## 2020-08-08 DIAGNOSIS — Z811 Family history of alcohol abuse and dependence: Secondary | ICD-10-CM | POA: Insufficient documentation

## 2020-08-08 DIAGNOSIS — D013 Carcinoma in situ of anus and anal canal: Secondary | ICD-10-CM | POA: Diagnosis not present

## 2020-08-08 DIAGNOSIS — Z87891 Personal history of nicotine dependence: Secondary | ICD-10-CM | POA: Diagnosis not present

## 2020-08-08 DIAGNOSIS — K629 Disease of anus and rectum, unspecified: Secondary | ICD-10-CM | POA: Diagnosis present

## 2020-08-08 DIAGNOSIS — Z79899 Other long term (current) drug therapy: Secondary | ICD-10-CM | POA: Insufficient documentation

## 2020-08-08 DIAGNOSIS — Z8249 Family history of ischemic heart disease and other diseases of the circulatory system: Secondary | ICD-10-CM | POA: Diagnosis not present

## 2020-08-08 DIAGNOSIS — Z833 Family history of diabetes mellitus: Secondary | ICD-10-CM | POA: Insufficient documentation

## 2020-08-08 HISTORY — PX: ANAL INTRAEPITHELIAL NEOPLASIA EXCISION: SHX5241

## 2020-08-08 HISTORY — DX: Concussion with loss of consciousness of unspecified duration, initial encounter: S06.0X9A

## 2020-08-08 HISTORY — PX: RECTAL EXAM UNDER ANESTHESIA: SHX6399

## 2020-08-08 HISTORY — DX: Concussion with loss of consciousness status unknown, initial encounter: S06.0XAA

## 2020-08-08 HISTORY — DX: COVID-19: U07.1

## 2020-08-08 HISTORY — DX: Essential (primary) hypertension: I10

## 2020-08-08 HISTORY — DX: Type 2 diabetes mellitus without complications: E11.9

## 2020-08-08 HISTORY — DX: Dyspnea, unspecified: R06.00

## 2020-08-08 HISTORY — DX: Migraine, unspecified, not intractable, without status migrainosus: G43.909

## 2020-08-08 LAB — POCT I-STAT, CHEM 8
BUN: 20 mg/dL (ref 6–20)
Calcium, Ion: 1.25 mmol/L (ref 1.15–1.40)
Chloride: 103 mmol/L (ref 98–111)
Creatinine, Ser: 0.8 mg/dL (ref 0.44–1.00)
Glucose, Bld: 108 mg/dL — ABNORMAL HIGH (ref 70–99)
HCT: 39 % (ref 36.0–46.0)
Hemoglobin: 13.3 g/dL (ref 12.0–15.0)
Potassium: 3.9 mmol/L (ref 3.5–5.1)
Sodium: 144 mmol/L (ref 135–145)
TCO2: 32 mmol/L (ref 22–32)

## 2020-08-08 LAB — GLUCOSE, CAPILLARY: Glucose-Capillary: 109 mg/dL — ABNORMAL HIGH (ref 70–99)

## 2020-08-08 SURGERY — EXAM UNDER ANESTHESIA, RECTUM
Anesthesia: Monitor Anesthesia Care | Site: Rectum

## 2020-08-08 MED ORDER — ONDANSETRON HCL 4 MG/2ML IJ SOLN: 4.0000 mg | Freq: Once | INTRAMUSCULAR | Status: DC | PRN

## 2020-08-08 MED ORDER — PROPOFOL 10 MG/ML IV BOLUS
INTRAVENOUS | Status: DC | PRN
Start: 2020-08-08 — End: 2020-08-08
  Administered 2020-08-08: 30 mg via INTRAVENOUS

## 2020-08-08 MED ORDER — TRAMADOL HCL 50 MG PO TABS: 50.0000 mg | ORAL_TABLET | Freq: Four times a day (QID) | ORAL | 0 refills | Status: DC | PRN

## 2020-08-08 MED ORDER — OXYCODONE HCL 5 MG/5ML PO SOLN
5.0000 mg | Freq: Once | ORAL | Status: DC | PRN
Start: 2020-08-08 — End: 2020-08-08

## 2020-08-08 MED ORDER — LACTATED RINGERS IV SOLN: INTRAVENOUS | Status: DC

## 2020-08-08 MED ORDER — KETAMINE HCL 10 MG/ML IJ SOLN
INTRAMUSCULAR | Status: DC | PRN
  Administered 2020-08-08: 10 mg via INTRAVENOUS

## 2020-08-08 MED ORDER — FENTANYL CITRATE (PF) 100 MCG/2ML IJ SOLN
INTRAMUSCULAR | Status: AC
  Filled 2020-08-08: qty 2

## 2020-08-08 MED ORDER — FENTANYL CITRATE (PF) 100 MCG/2ML IJ SOLN: 25.0000 ug | INTRAMUSCULAR | Status: DC | PRN

## 2020-08-08 MED ORDER — PROPOFOL 10 MG/ML IV BOLUS
INTRAVENOUS | Status: AC
  Filled 2020-08-08: qty 20

## 2020-08-08 MED ORDER — ONDANSETRON HCL 4 MG/2ML IJ SOLN
INTRAMUSCULAR | Status: DC | PRN
  Administered 2020-08-08: 4 mg via INTRAVENOUS

## 2020-08-08 MED ORDER — LIDOCAINE 5 % EX OINT
TOPICAL_OINTMENT | CUTANEOUS | Status: DC | PRN
  Administered 2020-08-08: 1

## 2020-08-08 MED ORDER — FENTANYL CITRATE (PF) 100 MCG/2ML IJ SOLN
INTRAMUSCULAR | Status: DC | PRN
  Administered 2020-08-08: 25 ug via INTRAVENOUS

## 2020-08-08 MED ORDER — KETAMINE HCL 10 MG/ML IJ SOLN
INTRAMUSCULAR | Status: AC
  Filled 2020-08-08: qty 1

## 2020-08-08 MED ORDER — SODIUM CHLORIDE 0.9% FLUSH: 3.0000 mL | Freq: Two times a day (BID) | INTRAVENOUS | Status: DC

## 2020-08-08 MED ORDER — OXYCODONE HCL 5 MG PO TABS: 5.0000 mg | ORAL_TABLET | Freq: Once | ORAL | Status: DC | PRN

## 2020-08-08 MED ORDER — ACETAMINOPHEN 500 MG PO TABS
ORAL_TABLET | ORAL | Status: AC
  Filled 2020-08-08: qty 2

## 2020-08-08 MED ORDER — MIDAZOLAM HCL 5 MG/5ML IJ SOLN
INTRAMUSCULAR | Status: DC | PRN
  Administered 2020-08-08: 2 mg via INTRAVENOUS

## 2020-08-08 MED ORDER — PROPOFOL 500 MG/50ML IV EMUL
INTRAVENOUS | Status: DC | PRN
  Administered 2020-08-08: 200 ug/kg/min via INTRAVENOUS

## 2020-08-08 MED ORDER — MIDAZOLAM HCL 2 MG/2ML IJ SOLN
INTRAMUSCULAR | Status: AC
  Filled 2020-08-08: qty 2

## 2020-08-08 MED ORDER — ONDANSETRON HCL 4 MG/2ML IJ SOLN
INTRAMUSCULAR | Status: AC
  Filled 2020-08-08: qty 2

## 2020-08-08 MED ORDER — ACETAMINOPHEN 500 MG PO TABS
1000.0000 mg | ORAL_TABLET | ORAL | Status: AC
  Administered 2020-08-08: 1000 mg via ORAL

## 2020-08-08 MED ORDER — BUPIVACAINE-EPINEPHRINE 0.5% -1:200000 IJ SOLN
INTRAMUSCULAR | Status: DC | PRN
  Administered 2020-08-08: 35 mL

## 2020-08-08 MED ORDER — ACETIC ACID 5 % SOLN
Status: DC | PRN
  Administered 2020-08-08: 1 via TOPICAL

## 2020-08-08 SURGICAL SUPPLY — 48 items
BENZOIN TINCTURE PRP APPL 2/3 (GAUZE/BANDAGES/DRESSINGS) ×2 IMPLANT
BLADE EXTENDED COATED 6.5IN (ELECTRODE) IMPLANT
BLADE HEX COATED 2.75 (ELECTRODE) ×2 IMPLANT
BLADE SURG 10 STRL SS (BLADE) IMPLANT
BRIEF STRETCH FOR OB PAD LRG (UNDERPADS AND DIAPERS) ×2 IMPLANT
CANISTER SUCT 3000ML PPV (MISCELLANEOUS) IMPLANT
COVER BACK TABLE 60X90IN (DRAPES) ×2 IMPLANT
COVER MAYO STAND STRL (DRAPES) ×2 IMPLANT
COVER WAND RF STERILE (DRAPES) IMPLANT
DECANTER SPIKE VIAL GLASS SM (MISCELLANEOUS) ×2 IMPLANT
DRAPE LAPAROTOMY 100X72 PEDS (DRAPES) ×2 IMPLANT
DRAPE UTILITY XL STRL (DRAPES) ×2 IMPLANT
DRSG PAD ABDOMINAL 8X10 ST (GAUZE/BANDAGES/DRESSINGS) ×2 IMPLANT
ELECT REM PT RETURN 9FT ADLT (ELECTROSURGICAL) ×2
ELECTRODE REM PT RTRN 9FT ADLT (ELECTROSURGICAL) ×1 IMPLANT
GAUZE SPONGE 4X4 12PLY STRL (GAUZE/BANDAGES/DRESSINGS) ×2 IMPLANT
GAUZE SPONGE 4X4 12PLY STRL LF (GAUZE/BANDAGES/DRESSINGS) ×2 IMPLANT
GLOVE SURG ENC MOIS LTX SZ6.5 (GLOVE) ×2 IMPLANT
GLOVE SURG UNDER POLY LF SZ7 (GLOVE) ×2 IMPLANT
GOWN STRL REUS W/TWL XL LVL3 (GOWN DISPOSABLE) ×2 IMPLANT
HYDROGEN PEROXIDE 16OZ (MISCELLANEOUS) IMPLANT
IV CATH 14GX2 1/4 (CATHETERS) IMPLANT
IV CATH 18G SAFETY (IV SOLUTION) IMPLANT
KIT SIGMOIDOSCOPE (SET/KITS/TRAYS/PACK) IMPLANT
KIT TURNOVER CYSTO (KITS) ×2 IMPLANT
LOOP VESSEL MAXI BLUE (MISCELLANEOUS) IMPLANT
NEEDLE HYPO 22GX1.5 SAFETY (NEEDLE) ×2 IMPLANT
NS IRRIG 500ML POUR BTL (IV SOLUTION) ×2 IMPLANT
PACK BASIN DAY SURGERY FS (CUSTOM PROCEDURE TRAY) ×2 IMPLANT
PAD ARMBOARD 7.5X6 YLW CONV (MISCELLANEOUS) IMPLANT
PENCIL SMOKE EVACUATOR (MISCELLANEOUS) ×2 IMPLANT
SPONGE HEMORRHOID 8X3CM (HEMOSTASIS) IMPLANT
SPONGE SURGIFOAM ABS GEL 12-7 (HEMOSTASIS) IMPLANT
SUCTION FRAZIER HANDLE 10FR (MISCELLANEOUS)
SUCTION TUBE FRAZIER 10FR DISP (MISCELLANEOUS) IMPLANT
SUT CHROMIC 2 0 SH (SUTURE) IMPLANT
SUT CHROMIC 3 0 SH 27 (SUTURE) ×2 IMPLANT
SUT ETHIBOND 0 (SUTURE) IMPLANT
SUT VIC AB 2-0 SH 27 (SUTURE)
SUT VIC AB 2-0 SH 27XBRD (SUTURE) IMPLANT
SUT VIC AB 3-0 SH 18 (SUTURE) IMPLANT
SUT VIC AB 3-0 SH 27 (SUTURE)
SUT VIC AB 3-0 SH 27XBRD (SUTURE) IMPLANT
SYR CONTROL 10ML LL (SYRINGE) ×2 IMPLANT
TOWEL OR 17X26 10 PK STRL BLUE (TOWEL DISPOSABLE) ×2 IMPLANT
TRAY DSU PREP LF (CUSTOM PROCEDURE TRAY) ×2 IMPLANT
TUBE CONNECTING 12X1/4 (SUCTIONS) ×2 IMPLANT
YANKAUER SUCT BULB TIP NO VENT (SUCTIONS) ×2 IMPLANT

## 2020-08-08 NOTE — Discharge Instructions (Addendum)
Beginning the day after surgery:  You may sit in a tub of warm water 2-3 times a day to relieve discomfort.  Eat a regular diet high in fiber.  Avoid foods that give you constipation or diarrhea.  Avoid foods that are difficult to digest, such as seeds, nuts, corn or popcorn.  Do not go any longer than 2 days without a bowel movement.  You may take a dose of Milk of Magnesia if you become constipated.    Drink 6-8 glasses of water daily.  Walking is encouraged.  Avoid strenuous activity and heavy lifting for one month after surgery.    Call the office if you have any questions or concerns.  Call immediately if you develop:   Excessive rectal bleeding (more than a cup or passing large clots)  Increased discomfort  Fever greater than 100 F  Difficulty urinating   Post Anesthesia Home Care Instructions  Activity: Get plenty of rest for the remainder of the day. A responsible individual must stay with you for 24 hours following the procedure.  For the next 24 hours, DO NOT: -Drive a car -Advertising copywriter -Drink alcoholic beverages -Take any medication unless instructed by your physician -Make any legal decisions or sign important papers.  Meals: Start with liquid foods such as gelatin or soup. Progress to regular foods as tolerated. Avoid greasy, spicy, heavy foods. If nausea and/or vomiting occur, drink only clear liquids until the nausea and/or vomiting subsides. Call your physician if vomiting continues.  Special Instructions/Symptoms: Your throat may feel dry or sore from the anesthesia or the breathing tube placed in your throat during surgery. If this causes discomfort, gargle with warm salt water. The discomfort should disappear within 24 hours.  Next dose of Tylenol after 1:15 PM today if needed.

## 2020-08-08 NOTE — Op Note (Addendum)
08/08/2020  10:52 AM  PATIENT:  Sara Mcintyre  60 y.o. female  Patient Care Team: Revelo, Elyse Jarvis, MD as PCP - General (Family Medicine)  PRE-OPERATIVE DIAGNOSIS:  ANAL MASS, POSITIVE ANAL PAP  POST-OPERATIVE DIAGNOSIS:  ANAL MASS, POSITIVE ANAL PAP  PROCEDURE:  Procedure(s): RECTAL EXAM UNDER ANESTHESIA EXCISION OF ANAL LESION  SURGEON:  Surgeon(s): Leighton Ruff, MD  ASSISTANT: Coralyn Pear Economopoulos MD   ANESTHESIA:   MAC  SPECIMEN:  Excision of left lateral anal mass  DISPOSITION OF SPECIMEN:  PATHOLOGY  COUNTS:  YES  PLAN OF CARE: Discharge to home after PACU  PATIENT DISPOSITION:  PACU - hemodynamically stable.  INDICATION: Anal mass, positive anal pap smear  OR FINDINGS: Left lateral anal mass  DESCRIPTION: the patient was identified in the preoperative holding area and taken to the OR where they were laid on the operating room table.  MAC anesthesia was induced without difficulty. The patient was then positioned in prone jackknife position with buttocks gently taped apart.  The patient was then prepped and draped in usual sterile fashion.  SCDs were noted to be in place prior to the initiation of anesthesia. A surgical timeout was performed indicating the correct patient, procedure, positioning and need for preoperative antibiotics.  A rectal block was performed using Marcaine with epinephrine 1%.    I began with a digital rectal exam. No palpable lesions were noted. I then placed a Hill-Ferguson anoscope into the anal canal and evaluated this completely. No fissures or prolapsed hemorrhoids were noted. A left lateral anal mass was noted. Two sponges soaked in 5% acetic acid were placed in the anal canal for two minutes and the entire anal canal was inspected again. There were no other acetowhite lesions noted.  The mass was gently grasped, pulled outwards, and excised with the Metzenbaum scissors. The anal wound was then closed with 2-0 chromic gut  suture in a continuous fashion. Bleeding was noted at the base of the wound after closure and that was controlled with interrupted 3-0 chromic gut sutures. Hemostasis was achieved. Lidocaine ointment was applied to the wound.   Sponge and instrument count was correct at the end of the case. Patient was transferred to the PACU in stable condition.   I was personally present during the key and critical portions of this procedure and immediately available throughout the entire procedure, as documented in my operative note.

## 2020-08-08 NOTE — Anesthesia Preprocedure Evaluation (Signed)
Anesthesia Evaluation  Patient identified by MRN, date of birth, ID band Patient awake    Reviewed: Allergy & Precautions, NPO status , Patient's Chart, lab work & pertinent test results  Airway Mallampati: II  TM Distance: >3 FB Neck ROM: Full    Dental  (+) Teeth Intact   Pulmonary asthma , former smoker,    Pulmonary exam normal        Cardiovascular hypertension, Pt. on medications  Rhythm:Regular Rate:Normal     Neuro/Psych  Headaches, Depression Bipolar Disorder Schizophrenia TIA   GI/Hepatic Neg liver ROS, GERD  Medicated,Anal mass   Endo/Other  diabetes  Renal/GU negative Renal ROS  negative genitourinary   Musculoskeletal  (+) Arthritis , Osteoarthritis,    Abdominal (+)  Abdomen: soft. Bowel sounds: normal.  Peds  Hematology  (+) HIV,   Anesthesia Other Findings   Reproductive/Obstetrics                             Anesthesia Physical Anesthesia Plan  ASA: III  Anesthesia Plan: MAC   Post-op Pain Management:    Induction:   PONV Risk Score and Plan: 2 and Ondansetron, Dexamethasone and Treatment may vary due to age or medical condition  Airway Management Planned: Natural Airway, Simple Face Mask and Nasal Cannula  Additional Equipment: None  Intra-op Plan:   Post-operative Plan:   Informed Consent: I have reviewed the patients History and Physical, chart, labs and discussed the procedure including the risks, benefits and alternatives for the proposed anesthesia with the patient or authorized representative who has indicated his/her understanding and acceptance.     Dental advisory given  Plan Discussed with: CRNA  Anesthesia Plan Comments: (Lab Results      Component                Value               Date                      WBC                      8.5                 03/27/2020                HGB                      13.3                08/08/2020                 HCT                      39.0                08/08/2020                MCV                      89.4                03/27/2020                PLT                      271  03/27/2020           Lab Results      Component                Value               Date                      NA                       144                 08/08/2020                K                        3.9                 08/08/2020                CO2                      27                  03/27/2020                GLUCOSE                  108 (H)             08/08/2020                BUN                      20                  08/08/2020                CREATININE               0.80                08/08/2020                CALCIUM                  9.0                 03/27/2020                GFRNONAA                 >60                 03/27/2020                GFRAA                    >60                 12/10/2019          )        Anesthesia Quick Evaluation

## 2020-08-08 NOTE — Interval H&P Note (Signed)
History and Physical Interval Note:  08/08/2020 6:48 AM  Sara Mcintyre  has presented today for surgery, with the diagnosis of ANAL MASS, POSITIVE ANAL PAP.  The various methods of treatment have been discussed with the patient and family. After consideration of risks, benefits and other options for treatment, the patient has consented to  Procedure(s) with comments: RECTAL EXAM UNDER ANESTHESIA (N/A) - START TIME OF 10:15 AM FOR 45 MINUTES IN ROOM 3 EXCISION OF ANAL LESION (N/A) as a surgical intervention.  The patient's history has been reviewed, patient examined, no change in status, stable for surgery.  I have reviewed the patient's chart and labs.  Questions were answered to the patient's satisfaction.     Vanita Panda, MD  Colorectal and General Surgery [facility]

## 2020-08-08 NOTE — Transfer of Care (Signed)
Immediate Anesthesia Transfer of Care Note  Patient: Sara Mcintyre  Procedure(s) Performed: RECTAL EXAM UNDER ANESTHESIA (N/A Rectum) EXCISION OF ANAL LESION (N/A Rectum)  Patient Location: PACU  Anesthesia Type:MAC  Level of Consciousness: oriented, drowsy, patient cooperative and responds to stimulation  Airway & Oxygen Therapy: Patient Spontanous Breathing and Patient connected to face mask oxygen  Post-op Assessment: Report given to RN and Post -op Vital signs reviewed and stable  Post vital signs: Reviewed and stable  Last Vitals:  Vitals Value Taken Time  BP 130/66 08/08/20 1037  Temp 36.4 C 08/08/20 1037  Pulse 80 08/08/20 1040  Resp 16 08/08/20 1040  SpO2 94 % 08/08/20 1040  Vitals shown include unvalidated device data.  Last Pain:  Vitals:   08/08/20 0714  TempSrc: Oral  PainSc: 0-No pain      Patients Stated Pain Goal: 3 (39/76/73 [id number])  Complications: No complications documented.

## 2020-08-08 NOTE — Op Note (Deleted)
PATIENT:  Sara Mcintyre  60 y.o. female   Patient Care Team: Revelo, Elyse Jarvis, MD as PCP - General (Family Medicine)   PRE-OPERATIVE DIAGNOSIS:  ANAL MASS, POSITIVE ANAL PAP   POST-OPERATIVE DIAGNOSIS:  ANAL MASS, POSITIVE ANAL PAP   PROCEDURE:  Procedure(s): RECTAL EXAM UNDER ANESTHESIA EXCISION OF ANAL LESION   SURGEON:  Surgeon(s): Leighton Ruff, MD   ASSISTANT: Coralyn Pear Economopoulos MD    ANESTHESIA:   MAC   SPECIMEN:  Excision of left lateral anal mass   DISPOSITION OF SPECIMEN:  PATHOLOGY   COUNTS:  YES   PLAN OF CARE: Discharge to home after PACU   PATIENT DISPOSITION:  PACU - hemodynamically stable.   INDICATION: Anal mass, positive anal pap smear   OR FINDINGS: Left lateral anal mass   DESCRIPTION: the patient was identified in the preoperative holding area and taken to the OR where they were laid on the operating room table.  MAC anesthesia was induced without difficulty. The patient was then positioned in prone jackknife position with buttocks gently taped apart.  The patient was then prepped and draped in usual sterile fashion.  SCDs were noted to be in place prior to the initiation of anesthesia. A surgical timeout was performed indicating the correct patient, procedure, positioning and need for preoperative antibiotics.  A rectal block was performed using Marcaine with epinephrine 1%.     I began with a digital rectal exam. No palpable lesions were noted. I then placed a Hill-Ferguson anoscope into the anal canal and evaluated this completely. No fissures or prolapsed hemorrhoids were noted. A left lateral anal mass was noted. Two sponges soaked in 5% acetic acid were placed in the anal canal for two minutes and the entire anal canal was inspected again. There were no other acetowhite lesions noted.  The mass was gently grasped, pulled outwards, and excised with the Metzenbaum scissors. The anal wound was then closed with 2-0 chromic gut suture in  a continuous fashion. Bleeding was noted at the base of the wound after closure and that was controlled with interrupted 3-0 chromic gut sutures. Hemostasis was achieved. Lidocaine ointment was applied to the wound.    Sponge and instrument count was correct at the end of the case. Patient was transferred to the PACU in stable condition.     I was personally present during the key and critical portions of this procedure and immediately available throughout the entire procedure, as documented in my operative note.

## 2020-08-08 NOTE — Anesthesia Postprocedure Evaluation (Signed)
Anesthesia Post Note  Patient: Sara Mcintyre  Procedure(s) Performed: RECTAL EXAM UNDER ANESTHESIA (N/A Rectum) EXCISION OF ANAL LESION (N/A Rectum)     Patient location during evaluation: PACU Anesthesia Type: MAC Level of consciousness: awake and alert Pain management: pain level controlled Vital Signs Assessment: post-procedure vital signs reviewed and stable Respiratory status: spontaneous breathing, nonlabored ventilation, respiratory function stable and patient connected to nasal cannula oxygen Cardiovascular status: stable and blood pressure returned to baseline Postop Assessment: no apparent nausea or vomiting Anesthetic complications: no   No complications documented.  Last Vitals:  Vitals:   08/08/20 1112 08/08/20 1200  BP: 134/82 (!) 142/76  Pulse: 68 70  Resp: 18 18  Temp:  36.7 C  SpO2: 96% 98%    Last Pain:  Vitals:   08/08/20 1200  TempSrc: Oral  PainSc: 0-No pain                 Belenda Cruise P Kailiana Granquist

## 2020-08-09 LAB — SURGICAL PATHOLOGY

## 2020-08-12 ENCOUNTER — Encounter (HOSPITAL_BASED_OUTPATIENT_CLINIC_OR_DEPARTMENT_OTHER): Payer: Self-pay | Admitting: General Surgery

## 2020-10-27 ENCOUNTER — Emergency Department
Admission: EM | Admit: 2020-10-27 | Discharge: 2020-10-27 | Disposition: A | Discharge status: Home / Self Care | Payer: Medicare Other | Attending: Family Medicine | Admitting: Family Medicine

## 2020-10-27 ENCOUNTER — Other Ambulatory Visit: Payer: Self-pay

## 2020-10-27 DIAGNOSIS — R42 Dizziness and giddiness: Secondary | ICD-10-CM | POA: Diagnosis not present

## 2020-10-27 DIAGNOSIS — Z5321 Procedure and treatment not carried out due to patient leaving prior to being seen by health care provider: Secondary | ICD-10-CM | POA: Diagnosis not present

## 2020-10-27 LAB — BASIC METABOLIC PANEL
Anion gap: 10 (ref 5–15)
BUN: 15 mg/dL (ref 6–20)
CO2: 27 mmol/L (ref 22–32)
Calcium: 9.1 mg/dL (ref 8.9–10.3)
Chloride: 104 mmol/L (ref 98–111)
Creatinine, Ser: 0.91 mg/dL (ref 0.44–1.00)
GFR, Estimated: >60 mL/min (ref >60)
Glucose, Bld: 137 mg/dL — ABNORMAL HIGH (ref 70–99)
Potassium: 3.6 mmol/L (ref 3.5–5.1)
Sodium: 141 mmol/L (ref 135–145)

## 2020-10-27 LAB — CBC
HCT: 38.7 % (ref 36.0–46.0)
Hemoglobin: 12.5 g/dL (ref 12.0–15.0)
MCH: 28.7 pg (ref 26.0–34.0)
MCHC: 32.3 g/dL (ref 30.0–36.0)
MCV: 89 fL (ref 80.0–100.0)
Platelets: 305 10*3/uL (ref 150–400)
RBC: 4.35 MIL/uL (ref 3.87–5.11)
RDW: 14.7 % (ref 11.5–15.5)
WBC: 5.6 10*3/uL (ref 4.0–10.5)
nRBC: 0 % (ref 0.0–0.2)

## 2020-10-27 LAB — CBG MONITORING, ED: Glucose-Capillary: 162 mg/dL — ABNORMAL HIGH (ref 70–99)

## 2020-10-27 LAB — TROPONIN I (HIGH SENSITIVITY): Troponin I (High Sensitivity): 3 ng/L (ref <18)

## 2020-10-27 NOTE — ED Notes (Signed)
This RN called pt's mobile phone number listed, pt states she has left campus and does not wish to be seen. Pt states she went to work.

## 2020-10-27 NOTE — ED Triage Notes (Addendum)
Pt to ED for dizziness that started yesterday. Denies cp. +CHF. Took inhalers this morning  Pt in NAD, RR even and unlabored. Alert and oriented, clear speech

## 2020-12-24 IMAGING — MR MR MRA NECK WO/W CM
6 of 8 series · 34 of 48 positions shown · IV contrast (7ml Gadavist)
Comparison: None.

CLINICAL DATA: Dizziness.

EXAM:
MR CIRCLE OF WILLIS WITHOUT CONTRAST
MRA OF THE NECK WITHOUT AND WITH CONTRAST
TECHNIQUE: Angiographic images of circle of Willis were obtained without
intravenous contrast. Angiographic images of the neck were obtained
using MRA technique without and with intravenous contrast.
CONTRAST:  7mL GADAVIST GADOBUTROL 1 MMOL/ML IV SOLN

[Series 14: angio_fl3d_cor_pre_ttc=2.0s · coronal · 0.9mm · 0.85mm/px · 8 of 104 slices shown]
[im 1/104]
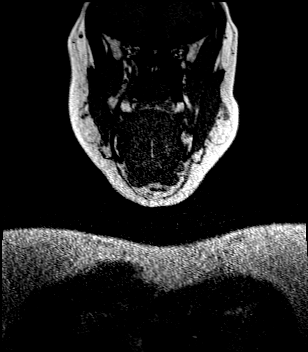
[im 15/104]
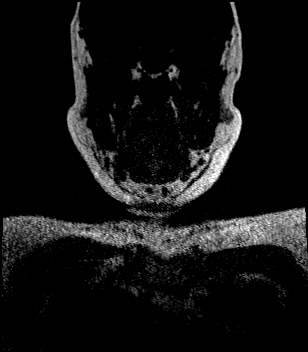
[im 30/104]
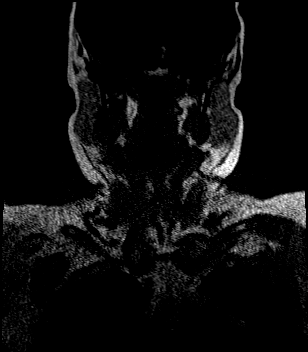
[im 45/104]
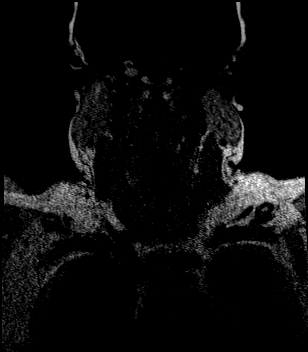
[im 59/104]
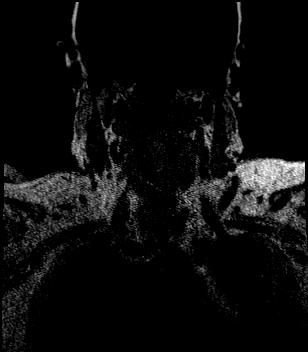
[im 74/104]
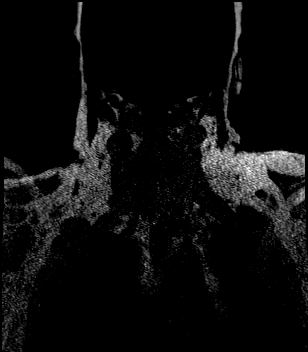
[im 89/104]
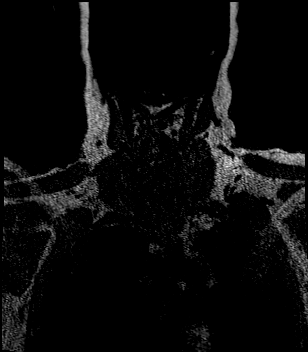
[im 104/104]
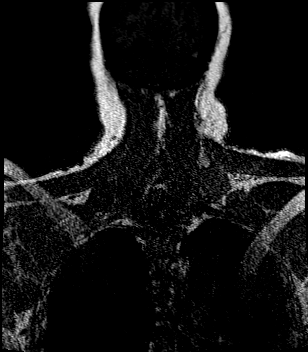

[Series 16: angio_fl3d_cor_post_ttc=2.0s · coronal · 0.9mm · 0.85mm/px · 8 of 104 slices shown]
[im 1/104]
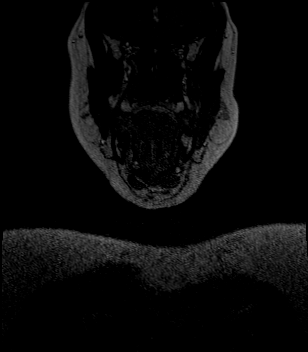
[im 15/104]
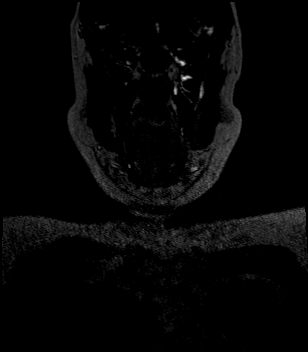
[im 30/104]
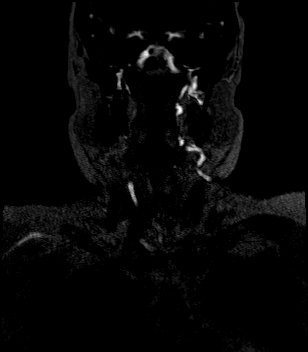
[im 45/104]
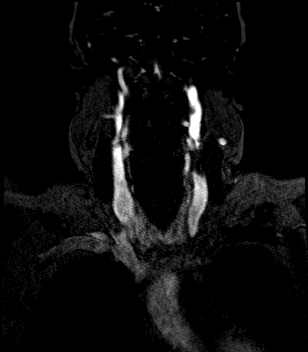
[im 59/104]
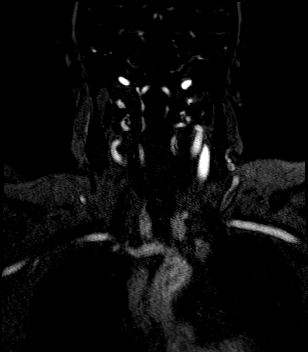
[im 74/104]
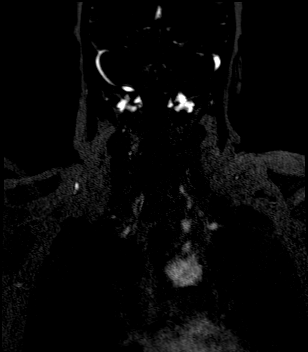
[im 89/104]
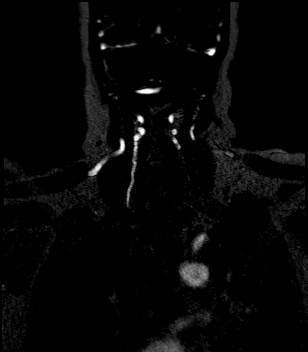
[im 104/104]
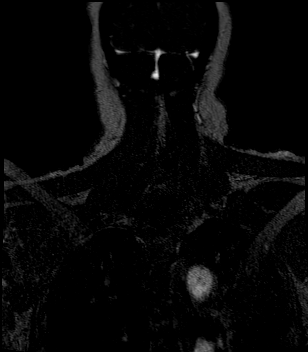

[Series 17: angio_fl3d_cor_post_ttc=2.0s_moco-adv · coronal · 0.9mm · 0.85mm/px · 8 of 104 slices shown]
[im 1/104]
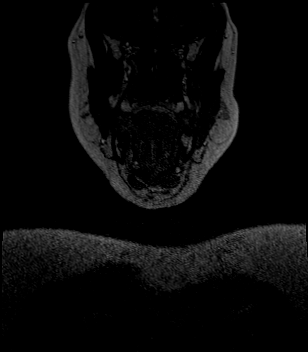
[im 15/104]
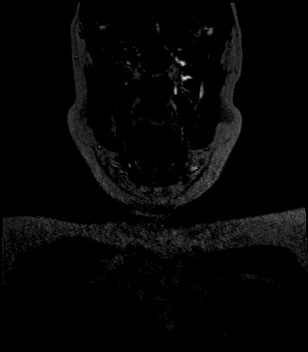
[im 30/104]
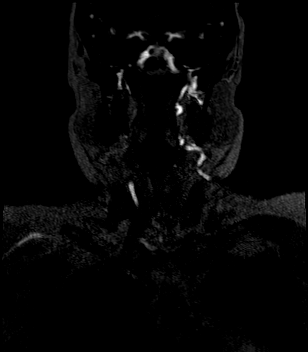
[im 45/104]
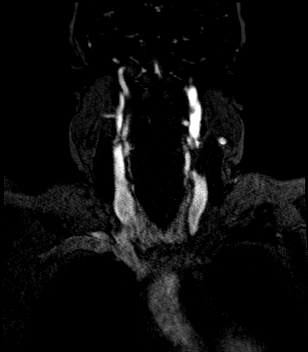
[im 59/104]
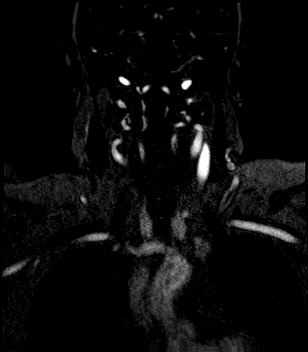
[im 74/104]
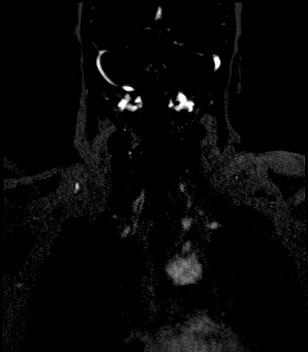
[im 89/104]
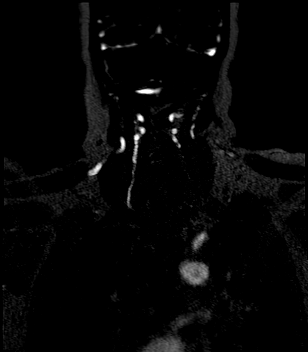
[im 104/104]
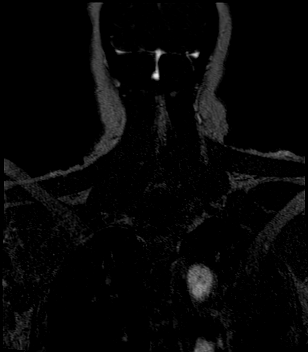

[Series 18: angio_fl3d_cor_post_ttc=2.0s_moco-adv_sub · coronal · 0.9mm · 0.85mm/px · 8 of 103 slices shown]
[im 1/103]
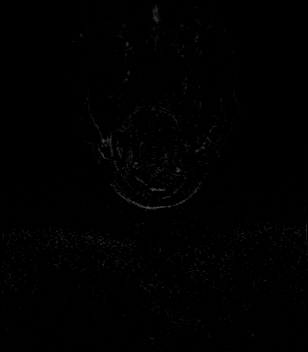
[im 15/103]
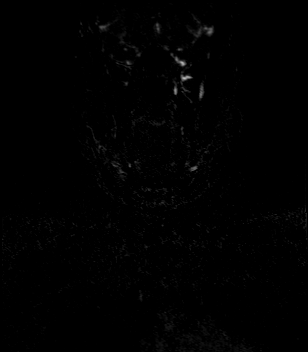
[im 30/103]
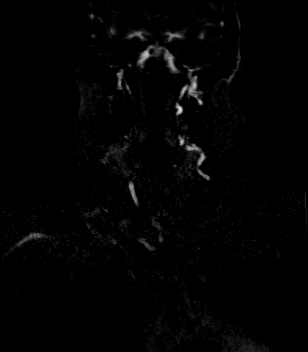
[im 44/103]
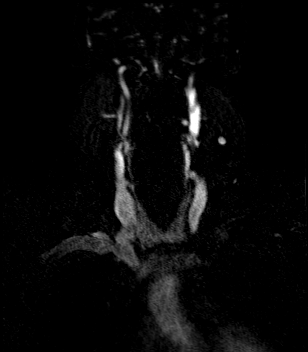
[im 59/103]
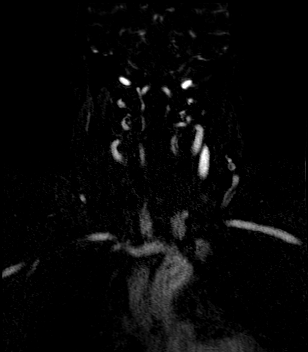
[im 73/103]
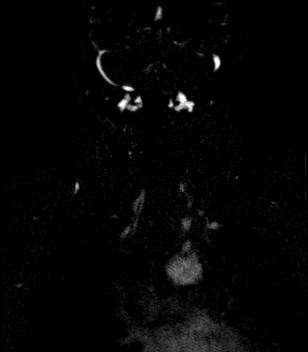
[im 88/103]
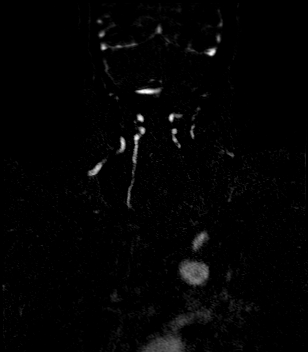
[im 103/103]
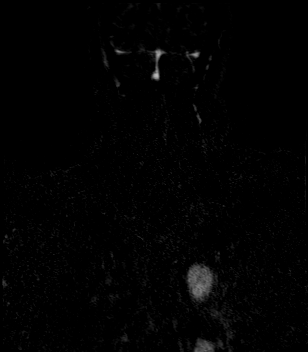

[Series 1034: l-r bilat carotids · coronal · 0.9mm · 0.43mm/px · 1 of 4 slices shown]
[im 1/4]
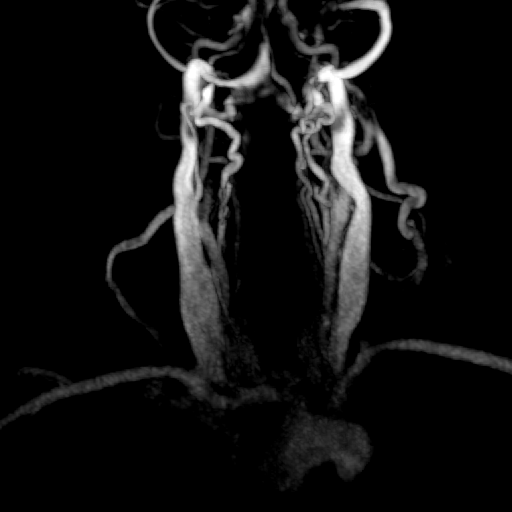

[Series 1052: l-r vertebrals · coronal · 0.9mm · 0.43mm/px · 1 of 10 slices shown]
[im 1/10]
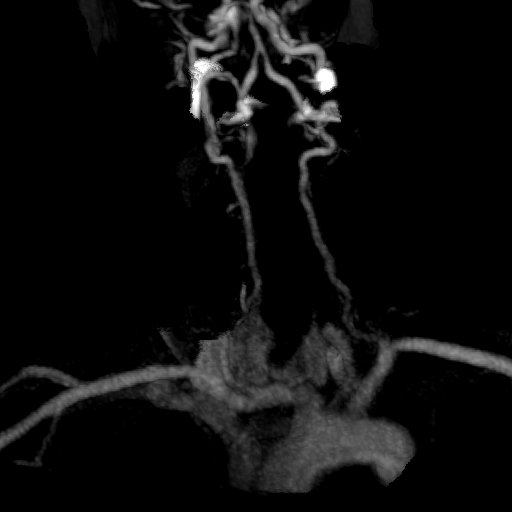

[34 of 48 positions shown; findings below may reference images not displayed]

FINDINGS: MR CIRCLE OF WILLIS FINDINGS

POSTERIOR CIRCULATION:

--Vertebral arteries: Normal V4 segments.

--Inferior cerebellar arteries: Normal.

--Basilar artery: Normal.

--Superior cerebellar arteries: Normal.

--Posterior cerebral arteries: Normal.

ANTERIOR CIRCULATION:

--Intracranial internal carotid arteries: Normal.

--Anterior cerebral arteries (ACA): Normal. Both A1 segments are
present. Patent anterior communicating artery (a-comm).

--Middle cerebral arteries (MCA): Normal.

MRA NECK FINDINGS

Normal carotid and vertebral arteries.
IMPRESSION: Normal MRA of the head and neck.

## 2020-12-24 IMAGING — MR MR MRA HEAD W/O CM
1 series · 20 of 48 positions shown · IV contrast (gadavist)
Comparison: None.

CLINICAL DATA: Dizziness.

EXAM:
MR CIRCLE OF WILLIS WITHOUT CONTRAST
MRA OF THE NECK WITHOUT AND WITH CONTRAST
TECHNIQUE: Angiographic images of circle of Willis were obtained without
intravenous contrast. Angiographic images of the neck were obtained
using MRA technique without and with intravenous contrast.
CONTRAST:  7mL GADAVIST GADOBUTROL 1 MMOL/ML IV SOLN

[Series 5: TOF · axial · 0.5mm · 0.41mm/px · z∈[-114,-16]mm · 20 of 205 slices shown]
[im 1/205]
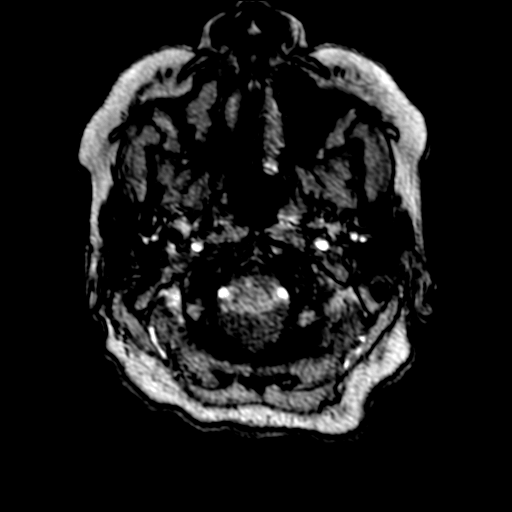
[im 5/205]
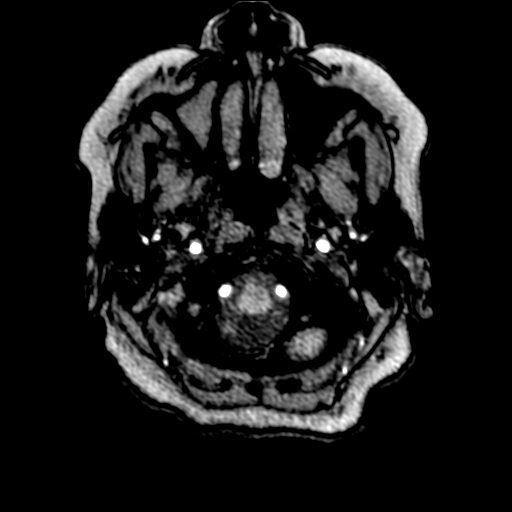
[im 9/205]
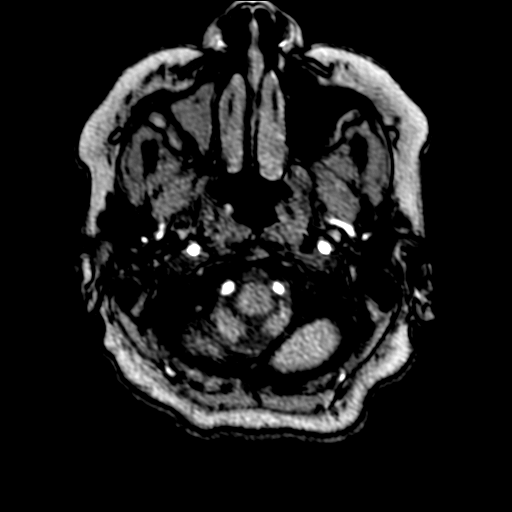
[im 14/205]
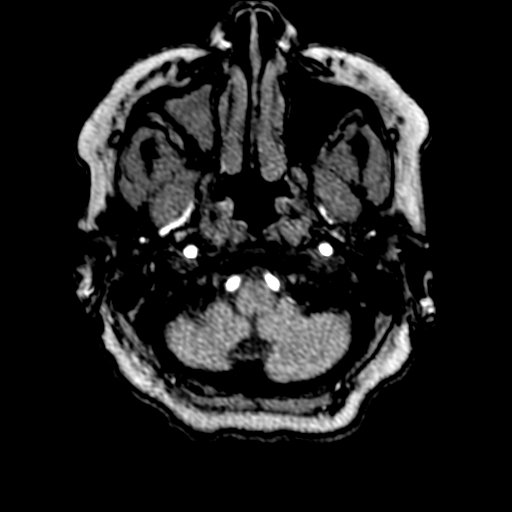
[im 18/205]
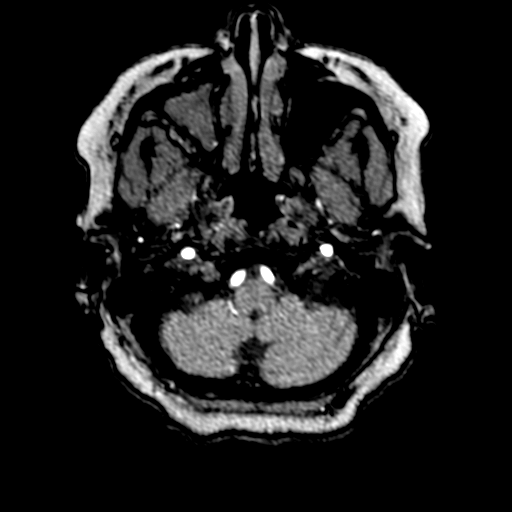
[im 22/205]
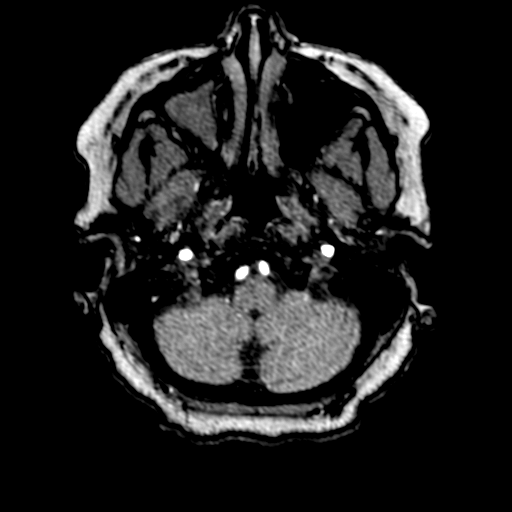
[im 27/205]
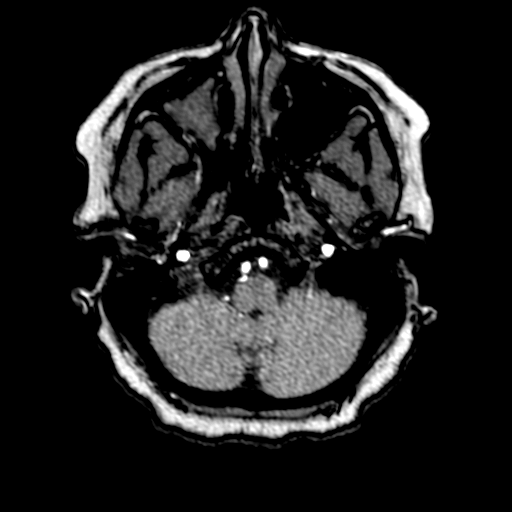
[im 31/205]
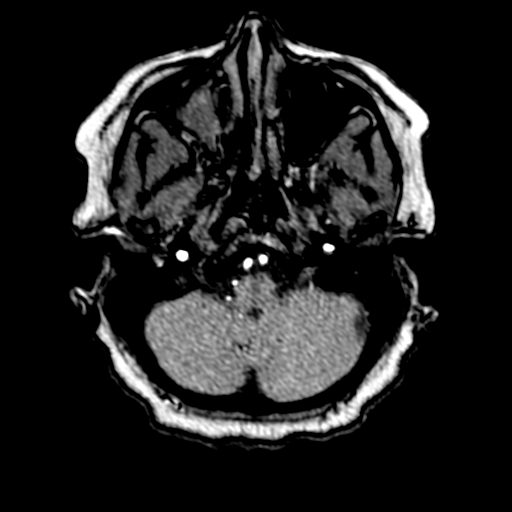
[im 35/205]
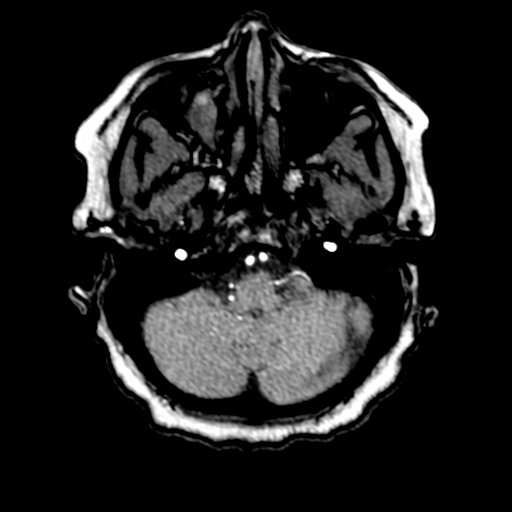
[im 40/205]
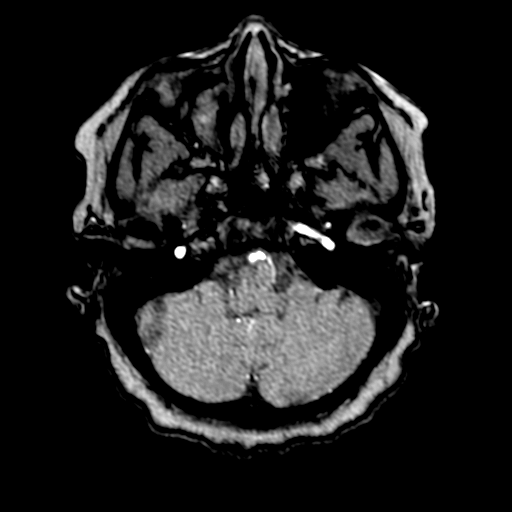
[im 44/205]
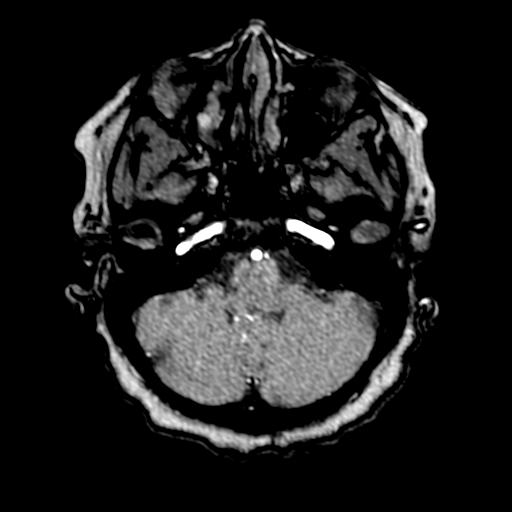
[im 48/205]
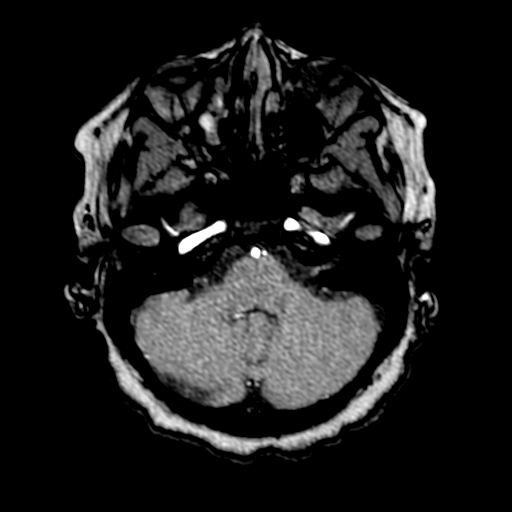
[im 66/205]
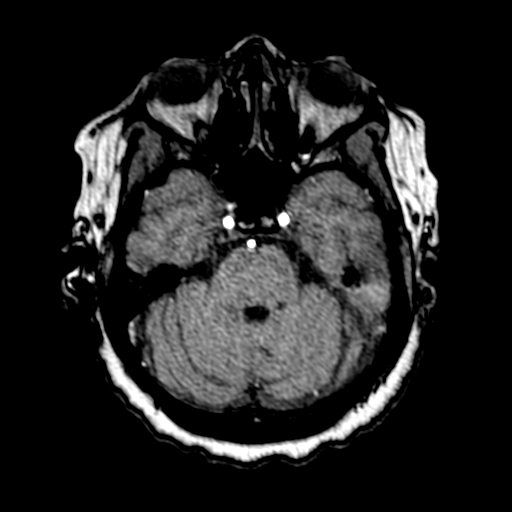
[im 92/205]
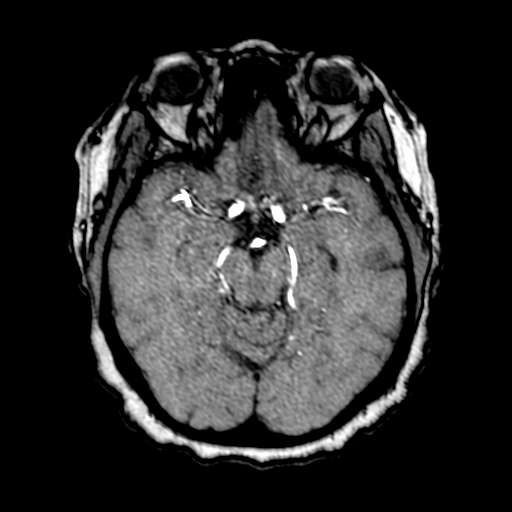
[im 105/205]
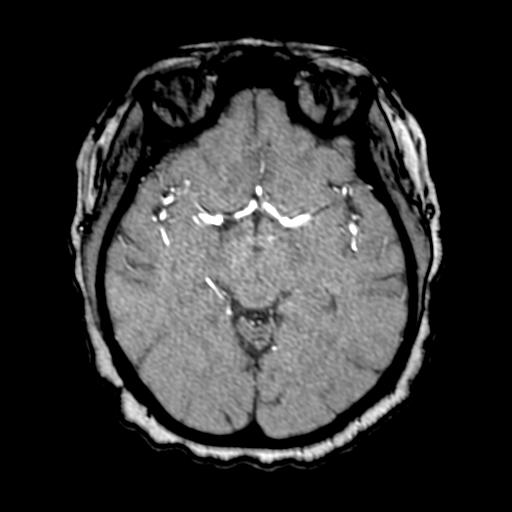
[im 118/205]
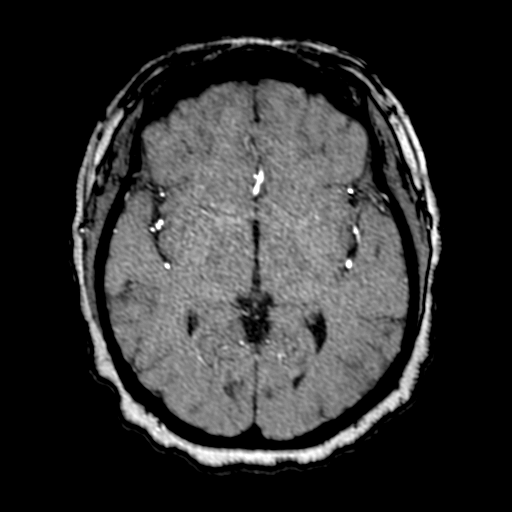
[im 144/205]
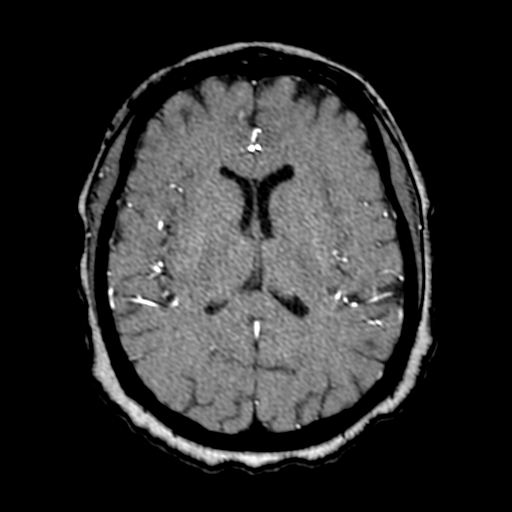
[im 170/205]
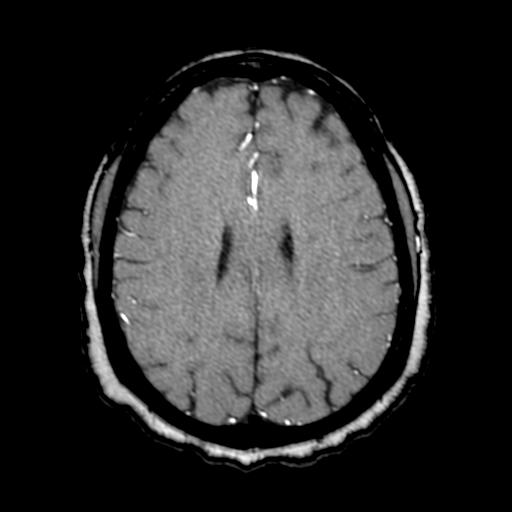
[im 174/205]
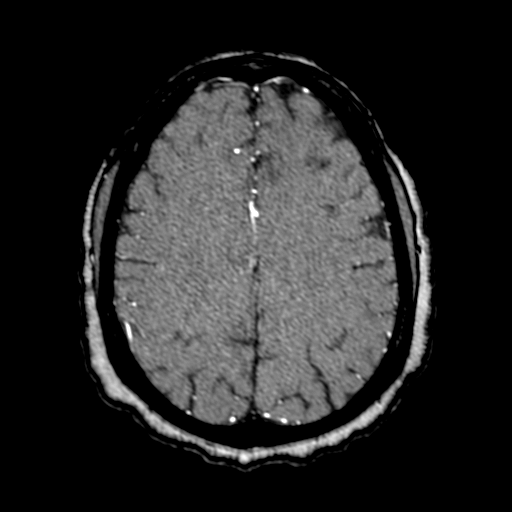
[im 196/205]
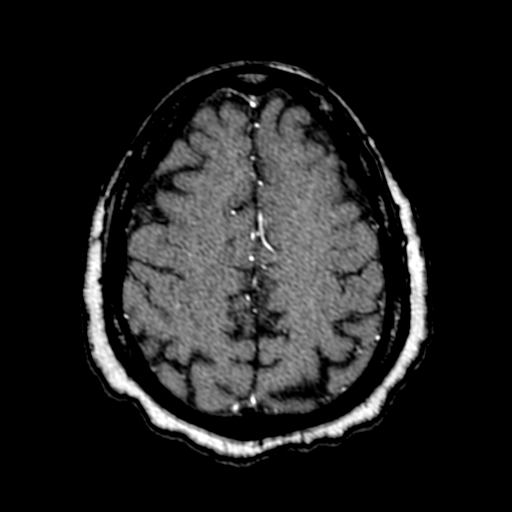

[20 of 48 positions shown; findings below may reference images not displayed]

FINDINGS: MR CIRCLE OF WILLIS FINDINGS

POSTERIOR CIRCULATION:

--Vertebral arteries: Normal V4 segments.

--Inferior cerebellar arteries: Normal.

--Basilar artery: Normal.

--Superior cerebellar arteries: Normal.

--Posterior cerebral arteries: Normal.

ANTERIOR CIRCULATION:

--Intracranial internal carotid arteries: Normal.

--Anterior cerebral arteries (ACA): Normal. Both A1 segments are
present. Patent anterior communicating artery (a-comm).

--Middle cerebral arteries (MCA): Normal.

MRA NECK FINDINGS

Normal carotid and vertebral arteries.
IMPRESSION: Normal MRA of the head and neck.

## 2021-01-30 ENCOUNTER — Other Ambulatory Visit: Payer: Self-pay

## 2021-01-30 ENCOUNTER — Encounter: Payer: Self-pay | Admitting: *Deleted

## 2021-01-30 ENCOUNTER — Emergency Department: Payer: Medicare Other

## 2021-01-30 DIAGNOSIS — Z87891 Personal history of nicotine dependence: Secondary | ICD-10-CM | POA: Insufficient documentation

## 2021-01-30 DIAGNOSIS — Z8616 Personal history of COVID-19: Secondary | ICD-10-CM | POA: Insufficient documentation

## 2021-01-30 DIAGNOSIS — J40 Bronchitis, not specified as acute or chronic: Secondary | ICD-10-CM | POA: Insufficient documentation

## 2021-01-30 DIAGNOSIS — I10 Essential (primary) hypertension: Secondary | ICD-10-CM | POA: Diagnosis not present

## 2021-01-30 DIAGNOSIS — Z20822 Contact with and (suspected) exposure to covid-19: Secondary | ICD-10-CM | POA: Diagnosis not present

## 2021-01-30 DIAGNOSIS — Z79899 Other long term (current) drug therapy: Secondary | ICD-10-CM | POA: Insufficient documentation

## 2021-01-30 DIAGNOSIS — R079 Chest pain, unspecified: Secondary | ICD-10-CM | POA: Diagnosis present

## 2021-01-30 DIAGNOSIS — Z21 Asymptomatic human immunodeficiency virus [HIV] infection status: Secondary | ICD-10-CM | POA: Diagnosis not present

## 2021-01-30 DIAGNOSIS — J45909 Unspecified asthma, uncomplicated: Secondary | ICD-10-CM | POA: Diagnosis not present

## 2021-01-30 DIAGNOSIS — E119 Type 2 diabetes mellitus without complications: Secondary | ICD-10-CM | POA: Insufficient documentation

## 2021-01-30 DIAGNOSIS — Z7951 Long term (current) use of inhaled steroids: Secondary | ICD-10-CM | POA: Diagnosis not present

## 2021-01-30 LAB — URINALYSIS, COMPLETE (UACMP) WITH MICROSCOPIC
Bilirubin Urine: NEGATIVE
Glucose, UA: NEGATIVE mg/dL
Ketones, ur: NEGATIVE mg/dL
Nitrite: NEGATIVE
Protein, ur: NEGATIVE mg/dL
Specific Gravity, Urine: 1.016 (ref 1.005–1.030)
pH: 6 (ref 5.0–8.0)

## 2021-01-30 LAB — COMPREHENSIVE METABOLIC PANEL
ALT: 27 U/L (ref 0–44)
AST: 21 U/L (ref 15–41)
Albumin: 4.1 g/dL (ref 3.5–5.0)
Alkaline Phosphatase: 75 U/L (ref 38–126)
Anion gap: 10 (ref 5–15)
BUN: 14 mg/dL (ref 6–20)
CO2: 25 mmol/L (ref 22–32)
Calcium: 8.8 mg/dL — ABNORMAL LOW (ref 8.9–10.3)
Chloride: 103 mmol/L (ref 98–111)
Creatinine, Ser: 0.74 mg/dL (ref 0.44–1.00)
GFR, Estimated: >60 mL/min (ref >60)
Glucose, Bld: 150 mg/dL — ABNORMAL HIGH (ref 70–99)
Potassium: 3 mmol/L — ABNORMAL LOW (ref 3.5–5.1)
Sodium: 138 mmol/L (ref 135–145)
Total Bilirubin: 0.6 mg/dL (ref 0.3–1.2)
Total Protein: 6.8 g/dL (ref 6.5–8.1)

## 2021-01-30 LAB — CBC
HCT: 35.5 % — ABNORMAL LOW (ref 36.0–46.0)
Hemoglobin: 11.7 g/dL — ABNORMAL LOW (ref 12.0–15.0)
MCH: 29.5 pg (ref 26.0–34.0)
MCHC: 33 g/dL (ref 30.0–36.0)
MCV: 89.4 fL (ref 80.0–100.0)
Platelets: 294 10*3/uL (ref 150–400)
RBC: 3.97 MIL/uL (ref 3.87–5.11)
RDW: 13.7 % (ref 11.5–15.5)
WBC: 6.6 10*3/uL (ref 4.0–10.5)
nRBC: 0 % (ref 0.0–0.2)

## 2021-01-30 LAB — TROPONIN I (HIGH SENSITIVITY): Troponin I (High Sensitivity): 3 ng/L (ref <18)

## 2021-01-30 LAB — LIPASE, BLOOD: Lipase: 28 U/L (ref 11–51)

## 2021-01-30 NOTE — ED Triage Notes (Signed)
Runny nose, scratchy throat, productive cough,  shortness of breath, chest pain (central) and diarrhea. Symptoms since yesterday. Denies fevers.

## 2021-01-31 ENCOUNTER — Emergency Department
Admission: EM | Admit: 2021-01-31 | Discharge: 2021-01-31 | Disposition: A | Discharge status: Home / Self Care | Payer: Medicare Other | Attending: Emergency Medicine | Admitting: Emergency Medicine

## 2021-01-31 DIAGNOSIS — J40 Bronchitis, not specified as acute or chronic: Secondary | ICD-10-CM

## 2021-01-31 DIAGNOSIS — R0789 Other chest pain: Secondary | ICD-10-CM

## 2021-01-31 LAB — TROPONIN I (HIGH SENSITIVITY): Troponin I (High Sensitivity): 5 ng/L (ref <18)

## 2021-01-31 LAB — RESP PANEL BY RT-PCR (FLU A&B, COVID) ARPGX2
Influenza A by PCR: NEGATIVE
Influenza B by PCR: NEGATIVE
SARS Coronavirus 2 by RT PCR: NEGATIVE

## 2021-01-31 MED ORDER — POTASSIUM CHLORIDE CRYS ER 20 MEQ PO TBCR
40.0000 meq | EXTENDED_RELEASE_TABLET | Freq: Once | ORAL | Status: AC
  Administered 2021-01-31: 40 meq via ORAL
  Filled 2021-01-31: qty 2

## 2021-01-31 MED ORDER — IPRATROPIUM-ALBUTEROL 0.5-2.5 (3) MG/3ML IN SOLN
3.0000 mL | Freq: Once | RESPIRATORY_TRACT | Status: AC
  Administered 2021-01-31: 3 mL via RESPIRATORY_TRACT
  Filled 2021-01-31: qty 3
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
## 2021-01-31 NOTE — ED Notes (Signed)
See triage note   Presents with diarrhea,chest congestion and body aches  States sx's started yesterday

## 2021-02-01 LAB — URINE CULTURE

## 2021-05-11 ENCOUNTER — Encounter: Payer: Self-pay | Admitting: Emergency Medicine

## 2021-05-11 ENCOUNTER — Other Ambulatory Visit: Payer: Self-pay

## 2021-05-11 DIAGNOSIS — J45909 Unspecified asthma, uncomplicated: Secondary | ICD-10-CM | POA: Insufficient documentation

## 2021-05-11 DIAGNOSIS — I10 Essential (primary) hypertension: Secondary | ICD-10-CM | POA: Insufficient documentation

## 2021-05-11 DIAGNOSIS — Z21 Asymptomatic human immunodeficiency virus [HIV] infection status: Secondary | ICD-10-CM | POA: Insufficient documentation

## 2021-05-11 DIAGNOSIS — Z7952 Long term (current) use of systemic steroids: Secondary | ICD-10-CM | POA: Diagnosis not present

## 2021-05-11 DIAGNOSIS — Z8616 Personal history of COVID-19: Secondary | ICD-10-CM | POA: Diagnosis not present

## 2021-05-11 DIAGNOSIS — R059 Cough, unspecified: Secondary | ICD-10-CM | POA: Diagnosis present

## 2021-05-11 DIAGNOSIS — Z79899 Other long term (current) drug therapy: Secondary | ICD-10-CM | POA: Insufficient documentation

## 2021-05-11 DIAGNOSIS — Z20822 Contact with and (suspected) exposure to covid-19: Secondary | ICD-10-CM | POA: Diagnosis not present

## 2021-05-11 DIAGNOSIS — E1169 Type 2 diabetes mellitus with other specified complication: Secondary | ICD-10-CM | POA: Insufficient documentation

## 2021-05-11 DIAGNOSIS — Z87891 Personal history of nicotine dependence: Secondary | ICD-10-CM | POA: Insufficient documentation

## 2021-05-11 DIAGNOSIS — E785 Hyperlipidemia, unspecified: Secondary | ICD-10-CM | POA: Diagnosis not present

## 2021-05-11 DIAGNOSIS — B349 Viral infection, unspecified: Secondary | ICD-10-CM | POA: Diagnosis not present

## 2021-05-11 LAB — RESP PANEL BY RT-PCR (FLU A&B, COVID) ARPGX2
Influenza A by PCR: NEGATIVE
Influenza B by PCR: NEGATIVE
SARS Coronavirus 2 by RT PCR: NEGATIVE

## 2021-05-11 NOTE — ED Triage Notes (Signed)
Pt to ED via POV with family members who are also patients, reports cough and cold symptoms since Monday after being exposed to a child who is sick. Pt alert and appropriate on arrival to ED. NAD Noted.

## 2021-05-12 ENCOUNTER — Emergency Department
Admission: EM | Admit: 2021-05-12 | Discharge: 2021-05-12 | Disposition: A | Discharge status: Home / Self Care | Payer: Medicare Other | Attending: Emergency Medicine | Admitting: Emergency Medicine

## 2021-05-12 DIAGNOSIS — B349 Viral infection, unspecified: Secondary | ICD-10-CM
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
## 2021-08-14 ENCOUNTER — Other Ambulatory Visit: Payer: Self-pay | Admitting: Student

## 2021-08-14 DIAGNOSIS — Z1231 Encounter for screening mammogram for malignant neoplasm of breast: Secondary | ICD-10-CM

## 2021-09-12 ENCOUNTER — Other Ambulatory Visit: Payer: Self-pay

## 2021-09-12 ENCOUNTER — Emergency Department
Admission: EM | Admit: 2021-09-12 | Discharge: 2021-09-12 | Disposition: A | Discharge status: Home / Self Care | Payer: Medicare Other | Attending: Emergency Medicine | Admitting: Emergency Medicine

## 2021-09-12 ENCOUNTER — Emergency Department: Payer: Medicare Other

## 2021-09-12 DIAGNOSIS — J45909 Unspecified asthma, uncomplicated: Secondary | ICD-10-CM | POA: Diagnosis not present

## 2021-09-12 DIAGNOSIS — Y92410 Unspecified street and highway as the place of occurrence of the external cause: Secondary | ICD-10-CM | POA: Insufficient documentation

## 2021-09-12 DIAGNOSIS — S0083XA Contusion of other part of head, initial encounter: Secondary | ICD-10-CM | POA: Diagnosis not present

## 2021-09-12 DIAGNOSIS — S39012A Strain of muscle, fascia and tendon of lower back, initial encounter: Secondary | ICD-10-CM | POA: Diagnosis not present

## 2021-09-12 DIAGNOSIS — S29012A Strain of muscle and tendon of back wall of thorax, initial encounter: Secondary | ICD-10-CM | POA: Diagnosis not present

## 2021-09-12 DIAGNOSIS — I10 Essential (primary) hypertension: Secondary | ICD-10-CM | POA: Diagnosis not present

## 2021-09-12 DIAGNOSIS — S161XXA Strain of muscle, fascia and tendon at neck level, initial encounter: Secondary | ICD-10-CM | POA: Diagnosis not present

## 2021-09-12 DIAGNOSIS — S0093XA Contusion of unspecified part of head, initial encounter: Secondary | ICD-10-CM

## 2021-09-12 DIAGNOSIS — E119 Type 2 diabetes mellitus without complications: Secondary | ICD-10-CM | POA: Insufficient documentation

## 2021-09-12 DIAGNOSIS — S4992XA Unspecified injury of left shoulder and upper arm, initial encounter: Secondary | ICD-10-CM | POA: Insufficient documentation

## 2021-09-12 DIAGNOSIS — S199XXA Unspecified injury of neck, initial encounter: Secondary | ICD-10-CM | POA: Diagnosis present

## 2021-09-12 DIAGNOSIS — S29019A Strain of muscle and tendon of unspecified wall of thorax, initial encounter: Secondary | ICD-10-CM

## 2021-09-12 DIAGNOSIS — G8911 Acute pain due to trauma: Secondary | ICD-10-CM

## 2021-09-12 MED ORDER — HYDROCODONE-ACETAMINOPHEN 5-325 MG PO TABS: 1.0000 | ORAL_TABLET | Freq: Four times a day (QID) | ORAL | 0 refills | Status: DC | PRN

## 2021-09-12 MED ORDER — HYDROCODONE-ACETAMINOPHEN 5-325 MG PO TABS
1.0000 | ORAL_TABLET | Freq: Once | ORAL | Status: AC
  Administered 2021-09-12: 1 via ORAL
  Filled 2021-09-12: qty 1
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
## 2021-09-12 NOTE — Discharge Instructions (Addendum)
Follow-up with your primary care provider if any continued problems or concerns.  You will be sore for approximately 4 to 5 days which is not unusual with this type of injury.  Moist heat or ice to your muscles as needed for discomfort.  A prescription for hydrocodone was sent to your pharmacy if needed for moderate to severe pain.  Try to move is much as possible to avoid getting stiff.  Tomorrow morning when you wake up you will have muscles that are aching which is not unusual also.  Return to the emergency department over the weekend if any severe worsening of your symptoms or urgent concerns. ?
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
## 2021-09-14 ENCOUNTER — Emergency Department: Payer: No Typology Code available for payment source

## 2021-09-14 ENCOUNTER — Emergency Department
Admission: EM | Admit: 2021-09-14 | Discharge: 2021-09-14 | Disposition: A | Discharge status: Home / Self Care | Payer: No Typology Code available for payment source | Attending: Emergency Medicine | Admitting: Emergency Medicine

## 2021-09-14 ENCOUNTER — Other Ambulatory Visit: Payer: Self-pay

## 2021-09-14 DIAGNOSIS — J45909 Unspecified asthma, uncomplicated: Secondary | ICD-10-CM | POA: Insufficient documentation

## 2021-09-14 DIAGNOSIS — R102 Pelvic and perineal pain: Secondary | ICD-10-CM | POA: Diagnosis present

## 2021-09-14 DIAGNOSIS — R012 Other cardiac sounds: Secondary | ICD-10-CM | POA: Diagnosis not present

## 2021-09-14 DIAGNOSIS — G8929 Other chronic pain: Secondary | ICD-10-CM | POA: Diagnosis not present

## 2021-09-14 DIAGNOSIS — M79652 Pain in left thigh: Secondary | ICD-10-CM | POA: Diagnosis not present

## 2021-09-14 DIAGNOSIS — M545 Low back pain, unspecified: Secondary | ICD-10-CM | POA: Diagnosis not present

## 2021-09-14 DIAGNOSIS — I10 Essential (primary) hypertension: Secondary | ICD-10-CM | POA: Insufficient documentation

## 2021-09-14 DIAGNOSIS — E119 Type 2 diabetes mellitus without complications: Secondary | ICD-10-CM | POA: Insufficient documentation

## 2021-09-14 DIAGNOSIS — Z8616 Personal history of COVID-19: Secondary | ICD-10-CM | POA: Diagnosis not present

## 2021-09-14 DIAGNOSIS — M25561 Pain in right knee: Secondary | ICD-10-CM | POA: Diagnosis not present

## 2021-09-14 DIAGNOSIS — M546 Pain in thoracic spine: Secondary | ICD-10-CM | POA: Insufficient documentation

## 2021-09-14 DIAGNOSIS — M25562 Pain in left knee: Secondary | ICD-10-CM | POA: Insufficient documentation

## 2021-09-14 DIAGNOSIS — R1030 Lower abdominal pain, unspecified: Secondary | ICD-10-CM | POA: Insufficient documentation

## 2021-09-14 DIAGNOSIS — Y92410 Unspecified street and highway as the place of occurrence of the external cause: Secondary | ICD-10-CM | POA: Insufficient documentation

## 2021-09-14 DIAGNOSIS — Z79899 Other long term (current) drug therapy: Secondary | ICD-10-CM | POA: Insufficient documentation

## 2021-09-14 DIAGNOSIS — M79651 Pain in right thigh: Secondary | ICD-10-CM | POA: Diagnosis not present

## 2021-09-14 DIAGNOSIS — Z21 Asymptomatic human immunodeficiency virus [HIV] infection status: Secondary | ICD-10-CM | POA: Diagnosis not present

## 2021-09-14 DIAGNOSIS — Z7951 Long term (current) use of inhaled steroids: Secondary | ICD-10-CM | POA: Diagnosis not present

## 2021-09-14 LAB — COMPREHENSIVE METABOLIC PANEL
ALT: 30 U/L (ref 0–44)
AST: 25 U/L (ref 15–41)
Albumin: 4.4 g/dL (ref 3.5–5.0)
Alkaline Phosphatase: 80 U/L (ref 38–126)
Anion gap: 5 (ref 5–15)
BUN: 13 mg/dL (ref 6–20)
CO2: 29 mmol/L (ref 22–32)
Calcium: 9.4 mg/dL (ref 8.9–10.3)
Chloride: 105 mmol/L (ref 98–111)
Creatinine, Ser: 0.71 mg/dL (ref 0.44–1.00)
GFR, Estimated: >60 mL/min (ref >60)
Glucose, Bld: 118 mg/dL — ABNORMAL HIGH (ref 70–99)
Potassium: 3.6 mmol/L (ref 3.5–5.1)
Sodium: 139 mmol/L (ref 135–145)
Total Bilirubin: 0.6 mg/dL (ref 0.3–1.2)
Total Protein: 7.7 g/dL (ref 6.5–8.1)

## 2021-09-14 LAB — URINALYSIS, ROUTINE W REFLEX MICROSCOPIC
Bilirubin Urine: NEGATIVE
Glucose, UA: NEGATIVE mg/dL
Ketones, ur: NEGATIVE mg/dL
Leukocytes,Ua: NEGATIVE
Nitrite: NEGATIVE
Protein, ur: NEGATIVE mg/dL
Specific Gravity, Urine: 1.012 (ref 1.005–1.030)
pH: 6 (ref 5.0–8.0)

## 2021-09-14 LAB — CBC
HCT: 38.4 % (ref 36.0–46.0)
Hemoglobin: 12.3 g/dL (ref 12.0–15.0)
MCH: 28.3 pg (ref 26.0–34.0)
MCHC: 32 g/dL (ref 30.0–36.0)
MCV: 88.5 fL (ref 80.0–100.0)
Platelets: 301 10*3/uL (ref 150–400)
RBC: 4.34 MIL/uL (ref 3.87–5.11)
RDW: 14.6 % (ref 11.5–15.5)
WBC: 5.9 10*3/uL (ref 4.0–10.5)
nRBC: 0 % (ref 0.0–0.2)

## 2021-09-14 LAB — LIPASE, BLOOD: Lipase: 28 U/L (ref 11–51)

## 2021-09-14 MED ORDER — ONDANSETRON HCL 4 MG/2ML IJ SOLN
4.0000 mg | Freq: Once | INTRAMUSCULAR | Status: AC
  Administered 2021-09-14: 4 mg via INTRAVENOUS
  Filled 2021-09-14: qty 2

## 2021-09-14 MED ORDER — IOHEXOL 300 MG/ML  SOLN
100.0000 mL | Freq: Once | INTRAMUSCULAR | Status: AC | PRN
  Administered 2021-09-14: 100 mL via INTRAVENOUS

## 2021-09-14 MED ORDER — MORPHINE SULFATE (PF) 4 MG/ML IV SOLN
4.0000 mg | Freq: Once | INTRAVENOUS | Status: AC
  Administered 2021-09-14: 4 mg via INTRAVENOUS
  Filled 2021-09-14: qty 1

## 2021-09-14 MED ORDER — METHOCARBAMOL 500 MG PO TABS: 500.0000 mg | ORAL_TABLET | Freq: Three times a day (TID) | ORAL | 0 refills | Status: DC | PRN

## 2021-09-14 NOTE — Discharge Instructions (Signed)
CT scan showed no acute traumatic injury but does show chronic changes within the lumbar spine which could be contributing to your increasing pain.  If you develop any numbness, weakness especially in both legs or around your inner thighs, genital area, difficulty holding your bowel or bladder, difficulty emptying your bladder, please return to the emergency department. ? ?May continue your hydrocodone as prescribed.  We are also giving you a prescription for muscle relaxers. ? ?Recommend if your pain is not improving that you follow-up closely with your primary care doctor. ?
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
## 2021-09-14 NOTE — ED Notes (Signed)
Patient transported to CT 
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
## 2021-10-17 ENCOUNTER — Ambulatory Visit: Payer: Self-pay | Admitting: General Surgery
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
## 2021-10-30 ENCOUNTER — Ambulatory Visit
Admission: RE | Admit: 2021-10-30 | Discharge: 2021-10-30 | Disposition: A | Discharge status: Home / Self Care | Payer: Medicare Other | Source: Ambulatory Visit | Attending: Student | Admitting: Student

## 2021-10-30 DIAGNOSIS — Z1231 Encounter for screening mammogram for malignant neoplasm of breast: Secondary | ICD-10-CM | POA: Diagnosis present

## 2021-12-08 ENCOUNTER — Encounter (HOSPITAL_BASED_OUTPATIENT_CLINIC_OR_DEPARTMENT_OTHER): Payer: Self-pay | Admitting: General Surgery

## 2021-12-08 NOTE — Progress Notes (Addendum)
Spoke w/ via phone for pre-op interview--- pt Lab needs dos----  Mirant results------ current ekg in epic / chart COVID test -----patient states asymptomatic no test needed Arrive at ------- 0630 on 12-17-2021 NPO after MN NO Solid Food.  Clear liquids from MN until--- 0530 Med rec completed Medications to take morning of surgery ----- none Diabetic medication ----- do not take metformin morning of surgery  Patient instructed no nail polish to be worn day of surgery Patient instructed to bring photo id and insurance card day of surgery Patient aware to have Driver (ride ) / caregiver for 24 hours after surgery -- daughter, angelica Patient Special Instructions ----- asked to bring rescue inhaler dos Pre-Op special Istructions ----- n/a Patient verbalized understanding of instructions that were given at this phone interview. Patient denies shortness of breath, chest pain, fever, cough at this phone interview.

## 2021-12-17 ENCOUNTER — Ambulatory Visit (HOSPITAL_BASED_OUTPATIENT_CLINIC_OR_DEPARTMENT_OTHER): Admission: RE | Admit: 2021-12-17 | Payer: Medicare Other | Source: Ambulatory Visit | Admitting: General Surgery

## 2021-12-17 DIAGNOSIS — Z01818 Encounter for other preprocedural examination: Secondary | ICD-10-CM

## 2021-12-17 HISTORY — DX: Paranoid schizophrenia: F20.0

## 2021-12-17 HISTORY — DX: Chronic viral hepatitis B without delta-agent: B18.1

## 2021-12-17 HISTORY — DX: Type 2 diabetes mellitus without complications: E11.9

## 2021-12-17 HISTORY — DX: Personal history of cervical dysplasia: Z87.410

## 2021-12-17 HISTORY — DX: Presence of spectacles and contact lenses: Z97.3

## 2021-12-17 HISTORY — DX: Spondylolysis, lumbar region: M43.06

## 2021-12-17 HISTORY — DX: Personal history of traumatic brain injury: Z87.820

## 2021-12-17 HISTORY — DX: Carcinoma in situ of anus and anal canal: D01.3

## 2021-12-17 HISTORY — DX: Post-traumatic stress disorder, unspecified: F43.10

## 2021-12-17 HISTORY — DX: Unspecified osteoarthritis, unspecified site: M19.90

## 2021-12-17 SURGERY — BIOPSY, RECTUM
Anesthesia: Monitor Anesthesia Care

## 2021-12-28 ENCOUNTER — Encounter: Payer: Self-pay | Admitting: Radiology

## 2021-12-28 ENCOUNTER — Emergency Department: Payer: Medicare Other

## 2021-12-28 ENCOUNTER — Other Ambulatory Visit: Payer: Self-pay

## 2021-12-28 DIAGNOSIS — M7989 Other specified soft tissue disorders: Secondary | ICD-10-CM | POA: Insufficient documentation

## 2021-12-28 DIAGNOSIS — J9811 Atelectasis: Secondary | ICD-10-CM | POA: Insufficient documentation

## 2021-12-28 DIAGNOSIS — I890 Lymphedema, not elsewhere classified: Secondary | ICD-10-CM | POA: Diagnosis not present

## 2021-12-28 LAB — COMPREHENSIVE METABOLIC PANEL
ALT: 23 U/L (ref 0–44)
AST: 19 U/L (ref 15–41)
Albumin: 4.1 g/dL (ref 3.5–5.0)
Alkaline Phosphatase: 67 U/L (ref 38–126)
Anion gap: 7 (ref 5–15)
BUN: 19 mg/dL (ref 6–20)
CO2: 25 mmol/L (ref 22–32)
Calcium: 9.2 mg/dL (ref 8.9–10.3)
Chloride: 110 mmol/L (ref 98–111)
Creatinine, Ser: 0.99 mg/dL (ref 0.44–1.00)
GFR, Estimated: >60 mL/min (ref >60)
Glucose, Bld: 138 mg/dL — ABNORMAL HIGH (ref 70–99)
Potassium: 3.4 mmol/L — ABNORMAL LOW (ref 3.5–5.1)
Sodium: 142 mmol/L (ref 135–145)
Total Bilirubin: 0.5 mg/dL (ref 0.3–1.2)
Total Protein: 7.4 g/dL (ref 6.5–8.1)

## 2021-12-28 LAB — CBC WITH DIFFERENTIAL/PLATELET
Abs Immature Granulocytes: 0.02 10*3/uL (ref 0.00–0.07)
Basophils Absolute: 0 10*3/uL (ref 0.0–0.1)
Basophils Relative: 1 %
Eosinophils Absolute: 0.1 10*3/uL (ref 0.0–0.5)
Eosinophils Relative: 2 %
HCT: 36 % (ref 36.0–46.0)
Hemoglobin: 11.4 g/dL — ABNORMAL LOW (ref 12.0–15.0)
Immature Granulocytes: 0 %
Lymphocytes Relative: 27 %
Lymphs Abs: 1.8 10*3/uL (ref 0.7–4.0)
MCH: 27.8 pg (ref 26.0–34.0)
MCHC: 31.7 g/dL (ref 30.0–36.0)
MCV: 87.8 fL (ref 80.0–100.0)
Monocytes Absolute: 0.4 10*3/uL (ref 0.1–1.0)
Monocytes Relative: 7 %
Neutro Abs: 4.1 10*3/uL (ref 1.7–7.7)
Neutrophils Relative %: 63 %
Platelets: 282 10*3/uL (ref 150–400)
RBC: 4.1 MIL/uL (ref 3.87–5.11)
RDW: 14.6 % (ref 11.5–15.5)
WBC: 6.4 10*3/uL (ref 4.0–10.5)
nRBC: 0 % (ref 0.0–0.2)

## 2021-12-28 NOTE — ED Triage Notes (Signed)
Pt states worked a long shift yesterday and began to have bilateral foot edema this am. Pt denies chest pain and shob. Pt without redness noted to feet.

## 2021-12-29 ENCOUNTER — Emergency Department
Admission: EM | Admit: 2021-12-29 | Discharge: 2021-12-29 | Disposition: A | Discharge status: Home / Self Care | Payer: Medicare Other | Attending: Emergency Medicine | Admitting: Emergency Medicine

## 2021-12-29 DIAGNOSIS — M7989 Other specified soft tissue disorders: Secondary | ICD-10-CM

## 2021-12-29 DIAGNOSIS — I890 Lymphedema, not elsewhere classified: Secondary | ICD-10-CM

## 2021-12-29 LAB — BRAIN NATRIURETIC PEPTIDE: B Natriuretic Peptide: 9.5 pg/mL (ref 0.0–100.0)

## 2021-12-29 LAB — TROPONIN I (HIGH SENSITIVITY): Troponin I (High Sensitivity): 6 ng/L (ref <18)

## 2021-12-29 NOTE — ED Provider Notes (Signed)
[facility] Provider Note    Event Date/Time   First MD Initiated Contact with Patient 12/29/21 0104     (approximate)   History   Foot Swelling   HPI  Sara Mcintyre is a 61 y.o. female who presents to the ED for evaluation of Foot Swelling   I reviewed general surgery visit from 5/5.  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  No history of CHF.  Patient presents to the ED for evaluation of increased lower extremity swelling bilaterally over the past couple days.  Reports that she often gets swelling, particularly when she works long hours and standing the whole time, but often improves when she goes home and sleeps.  She reports the improvement was lesser today and the swelling was more persistent so she presents to the ED.  Denies any falls or trauma, shortness of breath, rash, asymmetry, weakness or numbness to the legs.  Reports mild and diffuse aching pain to bilateral lower extremities associated with the swelling.   Physical Exam   Triage Vital Signs: ED Triage Vitals [12/28/21 2121]  Enc Vitals Group     BP (!) 146/81     Pulse Rate 68     Resp 18     Temp 98.7 F (37.1 C)     Temp Source Oral     SpO2 97 %     Weight 170 lb (77.1 kg)     Height 5\' 2"  (1.575 m)     Head Circumference      Peak Flow      Pain Score 8     Pain Loc      Pain Edu?      Excl. in GC?     Most recent vital signs: Vitals:   12/28/21 2121 12/29/21 0132  BP: (!) 146/81 (!) 144/77  Pulse: 68 61  Resp: 18 17  Temp: 98.7 F (37.1 C) 98.6 F (37 C)  SpO2: 97% 98%    General: Awake, no distress.  CV:  Good peripheral perfusion.  Resp:  Normal effort.  Abd:  No distention.  MSK:  No deformity noted.  Neuro:  No focal deficits appreciated. Other:     ED Results / Procedures / Treatments   Labs (all labs ordered are listed, but only abnormal results are displayed) Labs Reviewed  CBC WITH DIFFERENTIAL/PLATELET - Abnormal; Notable for the  following components:      Result Value   Hemoglobin 11.4 (*)    All other components within normal limits  COMPREHENSIVE METABOLIC PANEL - Abnormal; Notable for the following components:   Potassium 3.4 (*)    Glucose, Bld 138 (*)    All other components within normal limits  BRAIN NATRIURETIC PEPTIDE  TROPONIN I (HIGH SENSITIVITY)    EKG   RADIOLOGY CXR interpreted by me without evidence of acute cardiopulmonary pathology.  Official radiology report(s): DG Chest 2 View  Result Date: 12/28/2021 CLINICAL DATA:  Pedal edema EXAM: CHEST - 2 VIEW COMPARISON:  CT 09/14/2021 FINDINGS: Mild left basilar atelectasis or scar. Lungs are otherwise clear. No pneumothorax or pleural effusion. Cardiac size within normal limits. Pulmonary vascularity is normal. No acute bone abnormality. IMPRESSION: Mild left basilar atelectasis or scar. Electronically Signed   By: 11/14/2021 M.D.   On: 12/28/2021 22:19    PROCEDURES and INTERVENTIONS:  Procedures  Medications - No data to display   IMPRESSION / MDM / ASSESSMENT AND PLAN / ED COURSE  I reviewed the triage vital signs and the nursing notes.  Differential diagnosis includes, but is not limited to, CHF, lymphedema, DVT, trauma, cellulitis  61 year old female presents to the ED with acute on chronic bilateral lower extremity swelling, likely lymphedema and suitable for outpatient management.  She reports symptoms after prolonged periods of standing while at work all night.  We discussed conservative measures to address this such as elevation and compression.  Very unlikely to represent acute bilateral DVT without any overlying skin changes.  No stigmata of CHF, radiographic or serum evidence of CHF.  Reassuring and essentially normal CBC and metabolic panel.  Negative troponin.  We discussed return precautions and she is suitable for outpatient management.      FINAL CLINICAL IMPRESSION(S) / ED DIAGNOSES   Final diagnoses:  Leg swelling   Lymphedema     Rx / DC Orders   ED Discharge Orders     None        Note:  This document was prepared using Dragon voice recognition software and may include unintentional dictation errors.   Delton Prairie, MD 12/29/21 [phone]

## 2021-12-29 NOTE — Discharge Instructions (Signed)
Elevate your legs and use tight stockings such as compression socks to help reduce the swelling.  If you develop any difficulty breathing, difficulty using or feeling her feet, or spreading red rash suggestive of infection of the skin then please return to the ED.

## 2022-01-19 ENCOUNTER — Other Ambulatory Visit: Payer: Self-pay

## 2022-01-19 ENCOUNTER — Encounter (HOSPITAL_BASED_OUTPATIENT_CLINIC_OR_DEPARTMENT_OTHER): Payer: Self-pay | Admitting: General Surgery

## 2022-01-19 NOTE — Progress Notes (Signed)
Spoke w/ via phone for pre-op interview---pt Lab needs dos----I stat               Lab results------ekg 01-30-2021 chart/epic COVID test -----patient states asymptomatic no test needed Arrive at ------1000 am 01-22-2022 NPO after MN NO Solid Food.  Clear liquids from MN until---900 am Med rec completed Medications to take morning of surgery -----albuterol inhaler prn/bring inhaler Diabetic medication ----- no metformin  day of surgery Patient instructed no nail polish to be worn day of surgery Patient instructed to bring photo id and insurance card day of surgery Patient aware to have Driver (ride )  daughter angelica Sherilyn Cooter or friend Everlean Alstrom day/ caregiver   cousin  Janey Greaser   for 24 hours after surgery  Patient Special Instructions -----none Pre-Op special Istructions -----none Patient verbalized understanding of instructions that were given at this phone interview. Patient denies shortness of breath, chest pain, fever, cough at this phone interview.

## 2022-01-21 NOTE — Progress Notes (Signed)
Talked with patient to come in at 1000 and clear liquids until 0900

## 2022-01-22 ENCOUNTER — Ambulatory Visit (HOSPITAL_BASED_OUTPATIENT_CLINIC_OR_DEPARTMENT_OTHER)
Admission: RE | Admit: 2022-01-22 | Discharge: 2022-01-22 | Disposition: A | Discharge status: Home / Self Care | Payer: Medicare Other | Attending: General Surgery | Admitting: General Surgery

## 2022-01-22 ENCOUNTER — Other Ambulatory Visit: Payer: Self-pay

## 2022-01-22 ENCOUNTER — Encounter (HOSPITAL_BASED_OUTPATIENT_CLINIC_OR_DEPARTMENT_OTHER)
Admission: RE | Disposition: A | Discharge status: Home / Self Care | Payer: Self-pay | Source: Home / Self Care | Attending: General Surgery

## 2022-01-22 ENCOUNTER — Ambulatory Visit (HOSPITAL_BASED_OUTPATIENT_CLINIC_OR_DEPARTMENT_OTHER): Payer: Medicare Other | Admitting: Anesthesiology

## 2022-01-22 ENCOUNTER — Encounter (HOSPITAL_BASED_OUTPATIENT_CLINIC_OR_DEPARTMENT_OTHER): Payer: Self-pay | Admitting: General Surgery

## 2022-01-22 DIAGNOSIS — F209 Schizophrenia, unspecified: Secondary | ICD-10-CM | POA: Diagnosis not present

## 2022-01-22 DIAGNOSIS — K219 Gastro-esophageal reflux disease without esophagitis: Secondary | ICD-10-CM | POA: Diagnosis not present

## 2022-01-22 DIAGNOSIS — F418 Other specified anxiety disorders: Secondary | ICD-10-CM | POA: Diagnosis not present

## 2022-01-22 DIAGNOSIS — J449 Chronic obstructive pulmonary disease, unspecified: Secondary | ICD-10-CM | POA: Insufficient documentation

## 2022-01-22 DIAGNOSIS — D013 Carcinoma in situ of anus and anal canal: Secondary | ICD-10-CM

## 2022-01-22 DIAGNOSIS — I10 Essential (primary) hypertension: Secondary | ICD-10-CM | POA: Diagnosis not present

## 2022-01-22 DIAGNOSIS — Z7984 Long term (current) use of oral hypoglycemic drugs: Secondary | ICD-10-CM | POA: Diagnosis not present

## 2022-01-22 DIAGNOSIS — Z21 Asymptomatic human immunodeficiency virus [HIV] infection status: Secondary | ICD-10-CM | POA: Insufficient documentation

## 2022-01-22 DIAGNOSIS — E119 Type 2 diabetes mellitus without complications: Secondary | ICD-10-CM | POA: Insufficient documentation

## 2022-01-22 DIAGNOSIS — Z01818 Encounter for other preprocedural examination: Secondary | ICD-10-CM

## 2022-01-22 DIAGNOSIS — Z87891 Personal history of nicotine dependence: Secondary | ICD-10-CM

## 2022-01-22 HISTORY — PX: RECTAL BIOPSY: SHX2303

## 2022-01-22 HISTORY — DX: Localized edema: R60.0

## 2022-01-22 LAB — POCT I-STAT, CHEM 8
BUN: 11 mg/dL (ref 6–20)
Calcium, Ion: 1.15 mmol/L (ref 1.15–1.40)
Chloride: 105 mmol/L (ref 98–111)
Creatinine, Ser: 0.8 mg/dL (ref 0.44–1.00)
Glucose, Bld: 99 mg/dL (ref 70–99)
HCT: 39 % (ref 36.0–46.0)
Hemoglobin: 13.3 g/dL (ref 12.0–15.0)
Potassium: 3.9 mmol/L (ref 3.5–5.1)
Sodium: 140 mmol/L (ref 135–145)
TCO2: 25 mmol/L (ref 22–32)

## 2022-01-22 LAB — NO BLOOD PRODUCTS

## 2022-01-22 SURGERY — EXAM UNDER ANESTHESIA
Anesthesia: Monitor Anesthesia Care | Site: Rectum

## 2022-01-22 MED ORDER — PROPOFOL 500 MG/50ML IV EMUL
INTRAVENOUS | Status: AC
  Filled 2022-01-22: qty 50

## 2022-01-22 MED ORDER — OXYCODONE HCL 5 MG PO TABS: 5.0000 mg | ORAL_TABLET | Freq: Once | ORAL | Status: DC | PRN

## 2022-01-22 MED ORDER — OXYCODONE HCL 5 MG/5ML PO SOLN: 5.0000 mg | Freq: Once | ORAL | Status: DC | PRN

## 2022-01-22 MED ORDER — SODIUM CHLORIDE 0.9% FLUSH: 3.0000 mL | INTRAVENOUS | Status: DC | PRN

## 2022-01-22 MED ORDER — FENTANYL CITRATE (PF) 100 MCG/2ML IJ SOLN: 25.0000 ug | INTRAMUSCULAR | Status: DC | PRN

## 2022-01-22 MED ORDER — ACETAMINOPHEN 500 MG PO TABS
500.0000 mg | ORAL_TABLET | Freq: Once | ORAL | Status: AC
  Administered 2022-01-22: 500 mg via ORAL

## 2022-01-22 MED ORDER — OXYCODONE HCL 5 MG PO TABS: 5.0000 mg | ORAL_TABLET | ORAL | Status: DC | PRN

## 2022-01-22 MED ORDER — ACETAMINOPHEN 325 MG PO TABS: 650.0000 mg | ORAL_TABLET | ORAL | Status: DC | PRN

## 2022-01-22 MED ORDER — ACETIC ACID 5 % SOLN
Status: DC | PRN
  Administered 2022-01-22: 1 via TOPICAL

## 2022-01-22 MED ORDER — MIDAZOLAM HCL 2 MG/2ML IJ SOLN
INTRAMUSCULAR | Status: DC | PRN
  Administered 2022-01-22: 1 mg via INTRAVENOUS

## 2022-01-22 MED ORDER — PROPOFOL 10 MG/ML IV BOLUS
INTRAVENOUS | Status: DC | PRN
  Administered 2022-01-22: 30 mg via INTRAVENOUS

## 2022-01-22 MED ORDER — ONDANSETRON HCL 4 MG/2ML IJ SOLN: 4.0000 mg | Freq: Once | INTRAMUSCULAR | Status: DC | PRN

## 2022-01-22 MED ORDER — ACETAMINOPHEN 325 MG RE SUPP: 650.0000 mg | RECTAL | Status: DC | PRN

## 2022-01-22 MED ORDER — LIDOCAINE 2% (20 MG/ML) 5 ML SYRINGE
INTRAMUSCULAR | Status: DC | PRN
  Administered 2022-01-22: 60 mg via INTRAVENOUS

## 2022-01-22 MED ORDER — LACTATED RINGERS IV SOLN
INTRAVENOUS | Status: DC
Start: 2022-01-22 — End: 2022-01-22
  Administered 2022-01-22: 1000 mL via INTRAVENOUS

## 2022-01-22 MED ORDER — SODIUM CHLORIDE 0.9 % IV SOLN: 250.0000 mL | INTRAVENOUS | Status: DC | PRN

## 2022-01-22 MED ORDER — LIDOCAINE HCL (PF) 2 % IJ SOLN
INTRAMUSCULAR | Status: AC
  Filled 2022-01-22: qty 5

## 2022-01-22 MED ORDER — 0.9 % SODIUM CHLORIDE (POUR BTL) OPTIME
TOPICAL | Status: DC | PRN
  Administered 2022-01-22: 500 mL

## 2022-01-22 MED ORDER — OXYCODONE HCL 5 MG PO TABS: 5.0000 mg | ORAL_TABLET | Freq: Four times a day (QID) | ORAL | 0 refills | Status: DC | PRN

## 2022-01-22 MED ORDER — FENTANYL CITRATE (PF) 100 MCG/2ML IJ SOLN
INTRAMUSCULAR | Status: AC
  Filled 2022-01-22: qty 2

## 2022-01-22 MED ORDER — PROPOFOL 500 MG/50ML IV EMUL
INTRAVENOUS | Status: DC | PRN
  Administered 2022-01-22: 200 ug/kg/min via INTRAVENOUS

## 2022-01-22 MED ORDER — AMISULPRIDE (ANTIEMETIC) 5 MG/2ML IV SOLN: 10.0000 mg | Freq: Once | INTRAVENOUS | Status: DC | PRN

## 2022-01-22 MED ORDER — BUPIVACAINE LIPOSOME 1.3 % IJ SUSP
INTRAMUSCULAR | Status: DC | PRN
  Administered 2022-01-22: 20 mL

## 2022-01-22 MED ORDER — BUPIVACAINE-EPINEPHRINE 0.5% -1:200000 IJ SOLN
INTRAMUSCULAR | Status: DC | PRN
  Administered 2022-01-22: 30 mL

## 2022-01-22 MED ORDER — ACETAMINOPHEN 500 MG PO TABS: 1000.0000 mg | ORAL_TABLET | Freq: Once | ORAL | Status: DC

## 2022-01-22 MED ORDER — MIDAZOLAM HCL 2 MG/2ML IJ SOLN
INTRAMUSCULAR | Status: AC
  Filled 2022-01-22: qty 2

## 2022-01-22 MED ORDER — SODIUM CHLORIDE 0.9% FLUSH: 3.0000 mL | Freq: Two times a day (BID) | INTRAVENOUS | Status: DC

## 2022-01-22 MED ORDER — ACETAMINOPHEN 500 MG PO TABS
ORAL_TABLET | ORAL | Status: AC
  Filled 2022-01-22: qty 2

## 2022-01-22 SURGICAL SUPPLY — 39 items
ABDOMINAL PAD ABD ×1 IMPLANT
APL SKNCLS STERI-STRIP NONHPOA (GAUZE/BANDAGES/DRESSINGS) ×2
BENZOIN TINCTURE PRP APPL 2/3 (GAUZE/BANDAGES/DRESSINGS) ×4 IMPLANT
CLEANER CAUTERY TIP 5X5 PAD (MISCELLANEOUS) IMPLANT
COVER BACK TABLE 60X90IN (DRAPES) ×2 IMPLANT
COVER MAYO STAND STRL (DRAPES) ×2 IMPLANT
DECANTER SPIKE VIAL GLASS SM (MISCELLANEOUS) ×2 IMPLANT
DRAPE LAPAROTOMY 100X72 PEDS (DRAPES) ×2 IMPLANT
DRAPE UTILITY XL STRL (DRAPES) ×2 IMPLANT
DRSG PAD ABDOMINAL 8X10 ST (GAUZE/BANDAGES/DRESSINGS) ×2 IMPLANT
ELECT REM PT RETURN 9FT ADLT (ELECTROSURGICAL) ×2
ELECTRODE REM PT RTRN 9FT ADLT (ELECTROSURGICAL) ×1 IMPLANT
GAUZE 4X4 16PLY ~~LOC~~+RFID DBL (SPONGE) ×2 IMPLANT
GAUZE SPONGE 4X4 12PLY STRL (GAUZE/BANDAGES/DRESSINGS) ×2 IMPLANT
GLOVE BIO SURGEON STRL SZ 6.5 (GLOVE) ×3 IMPLANT
GLOVE BIOGEL PI IND STRL 6.5 (GLOVE) IMPLANT
GLOVE BIOGEL PI IND STRL 7.0 (GLOVE) ×1 IMPLANT
GLOVE BIOGEL PI INDICATOR 6.5 (GLOVE) ×1
GLOVE BIOGEL PI INDICATOR 7.0 (GLOVE) ×2
GLOVE INDICATOR 6.5 STRL GRN (GLOVE) ×2 IMPLANT
GOWN STRL REUS W/TWL LRG LVL3 (GOWN DISPOSABLE) ×2 IMPLANT
GOWN STRL REUS W/TWL XL LVL3 (GOWN DISPOSABLE) ×2 IMPLANT
KIT TURNOVER CYSTO (KITS) ×2 IMPLANT
NEEDLE HYPO 22GX1.5 SAFETY (NEEDLE) ×2 IMPLANT
NS IRRIG 500ML POUR BTL (IV SOLUTION) ×2 IMPLANT
PACK BASIN DAY SURGERY FS (CUSTOM PROCEDURE TRAY) ×2 IMPLANT
PAD ARMBOARD 7.5X6 YLW CONV (MISCELLANEOUS) ×2 IMPLANT
PAD CLEANER CAUTERY TIP 5X5 (MISCELLANEOUS) ×1
PANTS MESH DISP LRG (UNDERPADS AND DIAPERS) ×2 IMPLANT
PENCIL SMOKE EVACUATOR (MISCELLANEOUS) ×2 IMPLANT
SUT CHROMIC 2 0 SH (SUTURE) ×1 IMPLANT
SUT VIC AB 3-0 SH 27 (SUTURE) ×2
SUT VIC AB 3-0 SH 27XBRD (SUTURE) IMPLANT
SYR CONTROL 10ML LL (SYRINGE) ×2 IMPLANT
TOWEL OR 17X26 10 PK STRL BLUE (TOWEL DISPOSABLE) ×2 IMPLANT
TRAY DSU PREP LF (CUSTOM PROCEDURE TRAY) ×2 IMPLANT
TUBE CONNECTING 12X1/4 (SUCTIONS) ×2 IMPLANT
UNDERPAD 30X36 HEAVY ABSORB (UNDERPADS AND DIAPERS) ×2 IMPLANT
YANKAUER SUCT BULB TIP NO VENT (SUCTIONS) ×2 IMPLANT

## 2022-01-22 NOTE — Anesthesia Preprocedure Evaluation (Addendum)
Anesthesia Evaluation  Patient identified by MRN, date of birth, ID band Patient awake    Reviewed: Allergy & Precautions, NPO status , Patient's Chart, lab work & pertinent test results  History of Anesthesia Complications Negative for: history of anesthetic complications  Airway Mallampati: II  TM Distance: >3 FB Neck ROM: Full    Dental  (+) Upper Dentures   Pulmonary asthma , COPD,  COPD inhaler, former smoker,    Pulmonary exam normal        Cardiovascular hypertension, Pt. on medications Normal cardiovascular exam     Neuro/Psych PSYCHIATRIC DISORDERS (PTSD) Anxiety Depression Schizophrenia TIA   GI/Hepatic GERD  Medicated,(+) Hepatitis -, B  Endo/Other  diabetes, Oral Hypoglycemic Agents  Renal/GU negative Renal ROS  negative genitourinary   Musculoskeletal negative musculoskeletal ROS (+)   Abdominal   Peds  Hematology  (+) HIV,   Anesthesia Other Findings   Reproductive/Obstetrics negative OB ROS                            Anesthesia Physical Anesthesia Plan  ASA: 3  Anesthesia Plan: MAC   Post-op Pain Management: Minimal or no pain anticipated   Induction:   PONV Risk Score and Plan: 2 and Treatment may vary due to age or medical condition and Propofol infusion  Airway Management Planned: Natural Airway and Nasal Cannula  Additional Equipment: None  Intra-op Plan:   Post-operative Plan:   Informed Consent: I have reviewed the patients History and Physical, chart, labs and discussed the procedure including the risks, benefits and alternatives for the proposed anesthesia with the patient or authorized representative who has indicated his/her understanding and acceptance.       Plan Discussed with:   Anesthesia Plan Comments:         Anesthesia Quick Evaluation
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
## 2022-01-22 NOTE — Discharge Instructions (Addendum)
ANORECTAL SURGERY: POST OP INSTRUCTIONS Take your usually prescribed home medications unless otherwise directed. DIET: During the first few hours after surgery sip on some liquids until you are able to urinate.  It is normal to not urinate for several hours after this surgery.  If you feel uncomfortable, please contact the office for instructions.  After you are able to urinate,you may eat, if you feel like it.  Follow a light bland diet the first 24 hours after arrival home, such as soup, liquids, crackers, etc.  Be sure to include lots of fluids daily (6-8 glasses).  Avoid fast food or heavy meals, as your are more likely to get nauseated.  Eat a low fat diet the next few days after surgery.  Limit caffeine intake to 1-2 servings a day. PAIN CONTROL: Pain is best controlled by a usual combination of several different methods TOGETHER: Muscle relaxation: Soak in a warm bath (or Sitz bath) three times a day and after bowel movements.  Continue to do this until all pain is resolved. Over the counter pain medication Prescription pain medication Most patients will experience some swelling and discomfort in the anus/rectal area and incisions.  Heat such as warm towels, sitz baths, warm baths, etc to help relax tight/sore spots and speed recovery.  Some people prefer to use ice, especially in the first couple days after surgery, as it may decrease the pain and swelling, or alternate between ice & heat.  Experiment to what works for you.  Swelling and bruising can take several weeks to resolve.  Pain can take even longer to completely resolve. It is helpful to take an over-the-counter pain medication regularly for the first few weeks.  Choose one of the following that works best for you: Naproxen (Aleve, etc)  Two 220mg tabs twice a day Ibuprofen (Advil, etc) Three 200mg tabs four times a day (every meal & bedtime) A  prescription for pain medication (such as percocet, oxycodone, hydrocodone, etc) should be  given to you upon discharge.  Take your pain medication as prescribed.  If you are having problems/concerns with the prescription medicine (does not control pain, nausea, vomiting, rash, itching, etc), please call us [phone] to see if we need to switch you to a different pain medicine that will work better for you and/or control your side effect better. If you need a refill on your pain medication, please contact your pharmacy.  They will contact our office to request authorization. Prescriptions will not be filled after 5 pm or on week-ends. KEEP YOUR BOWELS REGULAR and AVOID CONSTIPATION The goal is one to two soft bowel movements a day.  You should at least have a bowel movement every other day. Avoid getting constipated.  Between the surgery and the pain medications, it is common to experience some constipation. This can be very painful after rectal surgery.  Increasing fluid intake and taking a fiber supplement (such as Metamucil, Citrucel, FiberCon, etc) 1-2 times a day regularly will usually help prevent this problem from occurring.  A stool softener like colace is also recommended.  This can be purchased over the counter at your pharmacy.  You can take it up to 3 times a day.  If you do not have a bowel movement after 24 hrs since your surgery, take one does of milk of magnesia.  If you still haven't had a bowel movement 8-12 hours after that dose, take another dose.  If you don't have a bowel movement 48 hrs after surgery,   purchase a Fleets enema from the drug store and administer gently per package instructions.  If you still are having trouble with your bowel movements after that, please call the office for further instructions. If you develop diarrhea or have many loose bowel movements, simplify your diet to bland foods & liquids for a few days.  Stop any stool softeners and decrease your fiber supplement.  Switching to mild anti-diarrheal medications (Kayopectate, Pepto Bismol) can help.   If this worsens or does not improve, please call us.  Wound Care Remove your bandages before your first bowel movement or 8 hours after surgery.     Remove any wound packing material at this tim,e as well.  You do not need to repack the wound unless instructed otherwise.  Wear an absorbent pad or soft cotton gauze in your underwear to catch any drainage and help keep the area clean. You should change this every 2-3 hours while awake. Keep the area clean and dry.  Bathe / shower every day, especially after bowel movements.  Keep the area clean by showering / bathing over the incision / wound.   It is okay to soak an open wound to help wash it.  Wet wipes or showers / gentle washing after bowel movements is often less traumatic than regular toilet paper. You may have some styrofoam-like soft packing in the rectum which will come out with the first bowel movement.  You will often notice bleeding with bowel movements.  This should slow down by the end of the first week of surgery Expect some drainage.  This should slow down, too, by the end of the first week of surgery.  Wear an absorbent pad or soft cotton gauze in your underwear until the drainage stops. Do Not sit on a rubber or pillow ring.  This can make you symptoms worse.  You may sit on a soft pillow if needed.  ACTIVITIES as tolerated:   You may resume regular (light) daily activities beginning the next day--such as daily self-care, walking, climbing stairs--gradually increasing activities as tolerated.  If you can walk 30 minutes without difficulty, it is safe to try more intense activity such as jogging, treadmill, bicycling, low-impact aerobics, swimming, etc. Save the most intensive and strenuous activity for last such as sit-ups, heavy lifting, contact sports, etc  Refrain from any heavy lifting or straining until you are off narcotics for pain control.   You may drive when you are no longer taking prescription pain medication, you can  comfortably sit for long periods of time, and you can safely maneuver your car and apply brakes. You may have sexual intercourse when it is comfortable.  FOLLOW UP in our office Please call [facility] at [phone] to set up an appointment to see your surgeon in the office for a follow-up appointment approximately 3-4 weeks after your surgery. Make sure that you call for this appointment the day you arrive home to insure a convenient appointment time. 10. IF YOU HAVE DISABILITY OR FAMILY LEAVE FORMS, BRING THEM TO THE OFFICE FOR PROCESSING.  DO NOT GIVE THEM TO YOUR DOCTOR.     WHEN TO CALL us [phone]: Poor pain control Reactions / problems with new medications (rash/itching, nausea, etc)  Fever over 101.5 F (38.5 C) Inability to urinate Nausea and/or vomiting Worsening swelling or bruising Continued bleeding from incision. Increased pain, redness, or drainage from the incision  The clinic staff is available to answer your questions during regular business hours (8:30am-5pm).  Please don't hesitate to call and ask to speak to one of our nurses for clinical concerns.   A surgeon from [facility] is always on call at the hospitals   If you have a medical emergency, go to the nearest emergency room or call 911.    [facility] [address], [address], [place], [place]  [address] ? MAIN: [phone]) [phone] ? TOLL FREE: [phone] ? FAX [phone] [email or web address]    Post Anesthesia Home Care Instructions  Activity: Get plenty of rest for the remainder of the day. A responsible individual must stay with you for 24 hours following the procedure.  For the next 24 hours, DO NOT: -Drive a car -Advertising copywriter -Drink alcoholic beverages -Take any medication unless instructed by your physician -Make any legal decisions or sign important papers.  Meals: Start with liquid foods such as gelatin or soup. Progress to regular foods  as tolerated. Avoid greasy, spicy, heavy foods. If nausea and/or vomiting occur, drink only clear liquids until the nausea and/or vomiting subsides. Call your physician if vomiting continues.  Special Instructions/Symptoms: Your throat may feel dry or sore from the anesthesia or the breathing tube placed in your throat during surgery. If this causes discomfort, gargle with warm salt water. The discomfort should disappear within 24 hours.  Information for Discharge Teaching: EXPAREL (bupivacaine liposome injectable suspension)   Your surgeon or anesthesiologist gave you EXPAREL(bupivacaine) to help control your pain after surgery.  EXPAREL is a local anesthetic that provides pain relief by numbing the tissue around the surgical site. EXPAREL is designed to release pain medication over time and can control pain for up to 72 hours. Depending on how you respond to EXPAREL, you may require less pain medication during your recovery.  Possible side effects: Temporary loss of sensation or ability to move in the area where bupivacaine was injected. Nausea, vomiting, constipation Rarely, numbness and tingling in your mouth or lips, lightheadedness, or anxiety may occur. Call your doctor right away if you think you may be experiencing any of these sensations, or if you have other questions regarding possible side effects.  Follow all other discharge instructions given to you by your surgeon or nurse. Eat a healthy diet and drink plenty of water or other fluids.  If you return to the hospital for any reason within 96 hours following the administration of EXPAREL, it is important for health care providers to know that you have received this anesthetic. A teal colored band has been placed on your arm with the date, time and amount of EXPAREL you have received in order to alert and inform your health care providers. Please leave this armband in place for the full 96 hours following administration, and then you  may remove the band.     Do not remove green armband before Monday, January 26, 2022.

## 2022-01-22 NOTE — Anesthesia Procedure Notes (Signed)
Procedure Name: MAC Date/Time: 01/22/2022 11:55 AM  Performed by: Suan Halter, CRNAPre-anesthesia Checklist: Patient identified, Emergency Drugs available, Suction available and Patient being monitored Patient Re-evaluated:Patient Re-evaluated prior to induction Oxygen Delivery Method: Supernova nasal CPAP Placement Confirmation: positive ETCO2

## 2022-01-22 NOTE — Transfer of Care (Signed)
Immediate Anesthesia Transfer of Care Note  Patient: Sara Mcintyre  Procedure(s) Performed: Procedure(s) (LRB): EXAM UNDER ANESTHESIA (N/A) EXCISIONAL BIOPSY of ANAL CANAL LESION (N/A)  Patient Location: Phase 2  Anesthesia Type: MAC  Level of Consciousness: awake, alert , oriented and patient cooperative  Airway & Oxygen Therapy: Patient Spontanous Breathing and Patient connected to face mask oxygen  Post-op Assessment: Report given to Phase 2 RN and Post -op Vital signs reviewed and stable  Post vital signs: Reviewed and stable  Complications: No apparent anesthesia complications Last Vitals:  Vitals Value Taken Time  BP 143/81 01/22/22 1330  Temp 37 C 01/22/22 1330  Pulse 81 01/22/22 1335  Resp 14 01/22/22 1246  SpO2 94 % 01/22/22 1335  Vitals shown include unvalidated device data.  Last Pain:  Vitals:   01/22/22 1415  TempSrc:   PainSc: 0-No pain      Patients Stated Pain Goal: 2 (78/58/85 0277)  Complications: No notable events documented.

## 2022-01-22 NOTE — Op Note (Signed)
01/22/2022  12:36 PM  PATIENT:  Sara Mcintyre  61 y.o. female  Patient Care Team: Revelo, Elyse Jarvis, MD as PCP - General (Family Medicine)  PRE-OPERATIVE DIAGNOSIS:  AIN III  POST-OPERATIVE DIAGNOSIS:  AIN III  PROCEDURE:  EXAM UNDER ANESTHESIA EXCISIONAL BIOPSY of ANAL CANAL LESION    Surgeon(s): Leighton Ruff, MD  ASSISTANT: Gwynn Burly, MD   ANESTHESIA:   local and MAC  SPECIMEN:  Source of Specimen:  R lateral anal canal lesion  DISPOSITION OF SPECIMEN:  PATHOLOGY  COUNTS:  YES  PLAN OF CARE: Discharge to home after PACU  PATIENT DISPOSITION:  PACU - hemodynamically stable.  INDICATION: 61 y.o. F with recurrent anal lesion   OR FINDINGS: Discrete R lateral anal canal lesion  DESCRIPTION: the patient was identified in the preoperative holding area and taken to the OR where they were laid on the operating room table.  MAC anesthesia was induced without difficulty. The patient was then positioned in prone jackknife position with buttocks gently taped apart.  The patient was then prepped and draped in usual sterile fashion.  SCDs were noted to be in place prior to the initiation of anesthesia. A surgical timeout was performed indicating the correct patient, procedure, positioning and need for preoperative antibiotics.  A rectal block was performed using Marcaine with epinephrine mixed with Experel.    I began with a digital rectal exam.  The mass could be palpated in the right lateral anal canal.  It was approximately 1-1/2 x 1 cm. I then placed a Hill-Ferguson anoscope into the anal canal and evaluated this completely.  I attempted to use acetic acid to stain the lesion but this did not turn acetowhite.  I marked out 5 to 10 mm margins around the lesion.  This extended outside the anal canal.  I then excised the mucosa and submucosal layers to remove the lesion en bloc.  Hemostasis was achieved as we went using electrocautery.  Sphincter complex was left intact.   The excision site was closed using a running 2-0 chromic suture.  Additional Marcaine with Exparel was placed around the incision site for postoperative pain control.  A dressing was applied.  The patient was awakened from anesthesia and sent to the postanesthesia care unit in stable condition.  All counts were correct per operating room staff.  I was personally present during the key and critical portions of this procedure and immediately available throughout the entire procedure, as documented in my operative note.   Rosario Adie, MD  [facility]

## 2022-01-23 ENCOUNTER — Encounter (HOSPITAL_BASED_OUTPATIENT_CLINIC_OR_DEPARTMENT_OTHER): Payer: Self-pay | Admitting: General Surgery

## 2022-01-23 LAB — SURGICAL PATHOLOGY

## 2022-01-26 ENCOUNTER — Encounter (HOSPITAL_BASED_OUTPATIENT_CLINIC_OR_DEPARTMENT_OTHER): Payer: Self-pay | Admitting: General Surgery

## 2022-01-26 NOTE — Anesthesia Postprocedure Evaluation (Signed)
Anesthesia Post Note  Patient: Sara Mcintyre  Procedure(s) Performed: Francia Greaves UNDER ANESTHESIA (Rectum) EXCISIONAL BIOPSY of ANAL CANAL LESION (Rectum)     Anesthesia Post Evaluation No notable events documented.  Last Vitals:  Vitals:   01/22/22 1315 01/22/22 1330  BP: (!) 160/86 (!) 143/81  Pulse: 76 79  Resp:    Temp:  37 C  SpO2: 95% 97%    Last Pain:  Vitals:   01/22/22 1415  TempSrc:   PainSc: 0-No pain   Pain Goal: Patients Stated Pain Goal: 2 (01/22/22 1047)                 Shanda Howells

## 2022-02-07 NOTE — Progress Notes (Signed)
Hello, it looks like Dr. Delton Prairie saw this patient on the above date.

## 2022-04-26 ENCOUNTER — Emergency Department
Admission: EM | Admit: 2022-04-26 | Discharge: 2022-04-26 | Disposition: A | Discharge status: Home / Self Care | Payer: Medicare Other | Attending: Emergency Medicine | Admitting: Emergency Medicine

## 2022-04-26 ENCOUNTER — Other Ambulatory Visit: Payer: Self-pay

## 2022-04-26 ENCOUNTER — Encounter: Payer: Self-pay | Admitting: Emergency Medicine

## 2022-04-26 DIAGNOSIS — H6591 Unspecified nonsuppurative otitis media, right ear: Secondary | ICD-10-CM | POA: Insufficient documentation

## 2022-04-26 DIAGNOSIS — I10 Essential (primary) hypertension: Secondary | ICD-10-CM | POA: Diagnosis not present

## 2022-04-26 DIAGNOSIS — J45909 Unspecified asthma, uncomplicated: Secondary | ICD-10-CM | POA: Insufficient documentation

## 2022-04-26 DIAGNOSIS — E119 Type 2 diabetes mellitus without complications: Secondary | ICD-10-CM | POA: Insufficient documentation

## 2022-04-26 DIAGNOSIS — H9201 Otalgia, right ear: Secondary | ICD-10-CM | POA: Diagnosis present

## 2022-04-26 LAB — CBG MONITORING, ED: Glucose-Capillary: 87 mg/dL (ref 70–99)

## 2022-04-26 MED ORDER — FLUTICASONE PROPIONATE 50 MCG/ACT NA SUSP: 2.0000 | Freq: Every day | NASAL | 0 refills | Status: AC

## 2022-04-26 MED ORDER — PREDNISONE 20 MG PO TABS: ORAL_TABLET | ORAL | 0 refills | Status: DC

## 2022-04-26 MED ORDER — CEFDINIR 300 MG PO CAPS: 300.0000 mg | ORAL_CAPSULE | Freq: Two times a day (BID) | ORAL | 0 refills | Status: DC

## 2022-04-26 NOTE — ED Provider Notes (Signed)
[facility] Provider Note    Event Date/Time   First MD Initiated Contact with Patient 04/26/22 [phone]     (approximate)   History   No chief complaint on file.   HPI  Sara Mcintyre is a 61 y.o. female   presents to the ED with complaint of right ear pain that started approximate 3 days ago.  Patient states that her ear feels swollen and numb.  She denies any known fever or chills.  No URI symptoms or trauma to her right ear.  Patient has a history of hypertension, diabetes, asthma, depression, PTSD, paranoid schizophrenia.      Physical Exam   Triage Vital Signs: ED Triage Vitals  Enc Vitals Group     BP 04/26/22 0901 (!) 154/89     Pulse Rate 04/26/22 0901 71     Resp 04/26/22 0901 16     Temp 04/26/22 0901 97.6 F (36.4 C)     Temp Source 04/26/22 0901 Oral     SpO2 04/26/22 0901 100 %     Weight 04/26/22 0902 164 lb (74.4 kg)     Height 04/26/22 0902 5\' 2"  (1.575 m)     Head Circumference --      Peak Flow --      Pain Score 04/26/22 0902 4     Pain Loc --      Pain Edu? --      Excl. in [place]? --     Most recent vital signs: Vitals:   04/26/22 0901  BP: (!) 154/89  Pulse: 71  Resp: 16  Temp: 97.6 F (36.4 C)  SpO2: 100%     General: Awake, no distress.  CV:  Good peripheral perfusion.  Heart regular rate and rhythm. Resp:  Normal effort.  Clear bilaterally. Abd:  No distention.  Other:  On examination of the left ear there is no gross deformity or foreign body.  EAC is clear.  TM is dull with mild erythema.  Right EAC is clear without foreign body.  TM is dull and bulging with fluid level noted.  Mild erythema generalized over the tympanic membrane.  Posterior pharynx without erythema, exudate and uvula is midline.  No cervical lymphadenopathy appreciated.   ED Results / Procedures / Treatments   Labs (all labs ordered are listed, but only abnormal results are displayed) Labs Reviewed  CBG MONITORING, ED       PROCEDURES:  Critical Care performed:   Procedures   MEDICATIONS ORDERED IN ED: Medications - No data to display   IMPRESSION / MDM / ASSESSMENT AND PLAN / ED COURSE  I reviewed the triage vital signs and the nursing notes.   Differential diagnosis includes, but is not limited to, cerumen impaction, eustachian tube dysfunction, otitis media, otitis externa, foreign body.  61 year old female presents to the ED with complaint of right ear pain without known upper respiratory symptoms.  Patient notes decreased hearing in that ear but no trauma.  Exam shows eustachian tube mildly erythematous with bulging and fluid level noted.  Patient is diabetic with more oral medication.  Glucose is 87.  Patient was made aware to adhere to her diabetic diet.  She was placed on a's short course of prednisone 20 mg for 7 days to help with the eustachian tube and fluid.  Omnicef was prescribed for the next 10 days twice daily and Flonase nasal spray 2 sprays each nostril once a day.  She is to follow-up with Dr.  Jenne Campus if not improving and also if her hearing does not improve as well.      Patient's presentation is most consistent with acute, uncomplicated illness.  FINAL CLINICAL IMPRESSION(S) / ED DIAGNOSES   Final diagnoses:  Otitis media with effusion, right     Rx / DC Orders   ED Discharge Orders          Ordered    cefdinir (OMNICEF) 300 MG capsule  2 times daily        04/26/22 1025    fluticasone (FLONASE) 50 MCG/ACT nasal spray  Daily        04/26/22 1025    predniSONE (DELTASONE) 20 MG tablet        04/26/22 1025             Note:  This document was prepared using Dragon voice recognition software and may include unintentional dictation errors.   Tommi Rumps, PA-C 04/26/22 1035    Chesley Noon, MD 04/26/22 [phone]

## 2022-04-26 NOTE — ED Triage Notes (Signed)
Pt states right ear started swelling on Friday with some loss of hearing, progressively got worse.  Pt states feeling of numbness due to swelling in right ear. Thinks could be a bug bite or ear infection. Pt grandchild has had ear infection this past week.

## 2022-04-26 NOTE — Discharge Instructions (Addendum)
Call Dr. Mikey Bussing office if not improving.  Take medications as prescribed.

## 2022-04-26 NOTE — ED Notes (Signed)
Patient c/o right ear inflammation with "stinging" pain. No drainage noted. Patient c/o right side swelling "all down my body." Patient is calm and cooperative. Patient given warm blanket.

## 2022-08-18 ENCOUNTER — Other Ambulatory Visit: Payer: Self-pay

## 2022-08-18 ENCOUNTER — Emergency Department
Admission: EM | Admit: 2022-08-18 | Discharge: 2022-08-18 | Disposition: A | Discharge status: Home / Self Care | Payer: 59 | Attending: Emergency Medicine | Admitting: Emergency Medicine

## 2022-08-18 ENCOUNTER — Emergency Department: Payer: 59

## 2022-08-18 DIAGNOSIS — E119 Type 2 diabetes mellitus without complications: Secondary | ICD-10-CM | POA: Insufficient documentation

## 2022-08-18 DIAGNOSIS — Y990 Civilian activity done for income or pay: Secondary | ICD-10-CM | POA: Diagnosis not present

## 2022-08-18 DIAGNOSIS — Z21 Asymptomatic human immunodeficiency virus [HIV] infection status: Secondary | ICD-10-CM | POA: Diagnosis not present

## 2022-08-18 DIAGNOSIS — G8929 Other chronic pain: Secondary | ICD-10-CM | POA: Insufficient documentation

## 2022-08-18 DIAGNOSIS — M545 Low back pain, unspecified: Secondary | ICD-10-CM | POA: Insufficient documentation

## 2022-08-18 DIAGNOSIS — X500XXA Overexertion from strenuous movement or load, initial encounter: Secondary | ICD-10-CM | POA: Insufficient documentation

## 2022-08-18 DIAGNOSIS — I10 Essential (primary) hypertension: Secondary | ICD-10-CM | POA: Diagnosis not present

## 2022-08-18 DIAGNOSIS — J45909 Unspecified asthma, uncomplicated: Secondary | ICD-10-CM | POA: Insufficient documentation

## 2022-08-18 DIAGNOSIS — Z8673 Personal history of transient ischemic attack (TIA), and cerebral infarction without residual deficits: Secondary | ICD-10-CM | POA: Insufficient documentation

## 2022-08-18 MED ORDER — LIDOCAINE 5 % EX PTCH: 1.0000 | MEDICATED_PATCH | Freq: Two times a day (BID) | CUTANEOUS | 0 refills | Status: AC

## 2022-08-18 MED ORDER — MELOXICAM 15 MG PO TABS: 15.0000 mg | ORAL_TABLET | Freq: Every day | ORAL | 0 refills | Status: AC

## 2022-08-18 MED ORDER — LIDOCAINE 5 % EX PTCH
1.0000 | MEDICATED_PATCH | CUTANEOUS | Status: DC
  Administered 2022-08-18: 1 via TRANSDERMAL
  Filled 2022-08-18: qty 1

## 2022-08-18 NOTE — Discharge Instructions (Signed)
Call make an appoint with Dr. Sharlet Salina is who you have seen over [facility] for your back pain.  A prescription for Lidoderm patches and meloxicam was sent to the pharmacy for you to begin taking for your back.  You may continue with your gabapentin as prescribed by your doctor.  You may use ice or heat to your back as needed for discomfort.

## 2022-08-18 NOTE — ED Triage Notes (Signed)
Pt comes with c/o 2 days of back pain. Pt stats she injured it at work and wants to file for [facility]. Pt states lower back from too much heavy lifting.

## 2022-08-18 NOTE — ED Provider Notes (Signed)
[facility] Provider Note    Event Date/Time   First MD Initiated Contact with Patient 08/18/22 1013     (approximate)   History   Back Pain   HPI  Sara Mcintyre is a 62 y.o. female   presents to the ED with complaint of low back pain for approximately 2 days.  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  She reports that she has been taking gabapentin actually took 2 today to see if this helped with her back pain.  Patient has been seen by Dr. Sharlet Salina at [facility]  She states that she has had a "epidural there".  She denies any other symptoms such as incontinence of bowel or bladder or urinary symptoms.  Patient continues to be ambulatory without any assistance.  She has a history of lumbar spondylosis, type 2 diabetes, hypertension, asthma, schizophrenia, concussion, TIA, PTSD, hepatitis B and HIV.      Physical Exam   Triage Vital Signs: ED Triage Vitals [08/18/22 0950]  Enc Vitals Group     BP 129/74     Pulse Rate 92     Resp 18     Temp 98 F (36.7 C)     Temp src      SpO2 100 %     Weight      Height      Head Circumference      Peak Flow      Pain Score 7     Pain Loc      Pain Edu?      Excl. in [place]?     Most recent vital signs: Vitals:   08/18/22 0950  BP: 129/74  Pulse: 92  Resp: 18  Temp: 98 F (36.7 C)  SpO2: 100%     General: Awake, no distress.  CV:  Good peripheral perfusion.  Resp:  Normal effort.  Abd:  No distention.  Other:  Generalized tenderness on palpation of the lower lumbar spine and paravertebral muscles bilaterally.  No gross deformity and no evidence of skin discoloration or abrasions are noted.  Good muscle strength bilaterally at 5/5.  Patient is able to stand and ambulate without any assistance.   ED Results / Procedures / Treatments   Labs (all labs ordered are listed, but only abnormal results are displayed) Labs  Reviewed - No data to display   RADIOLOGY Lumbar spine x-ray images were reviewed by myself independent of the radiologist and no acute fracture or spondylolithiasis noted.  Radiology report mentions degenerative facet disease at L4-L5 and L5-S1.    PROCEDURES:  Critical Care performed:   Procedures   MEDICATIONS ORDERED IN ED: Medications  lidocaine (LIDODERM) 5 % 1 patch (1 patch Transdermal Patch Applied 08/18/22 1127)     IMPRESSION / MDM / ASSESSMENT AND PLAN / ED COURSE  I reviewed the triage vital signs and the nursing notes.   Differential diagnosis includes, but is not limited to, acute exacerbation of chronic back pain, compression fracture, degenerative disc disease.  62 year old female presents to the ED with complaint of low back pain for the last 2 days that she states is caused by emptying coffee pots while at work.  Patient currently is a patient at [facility] is being seen for chronic back pain at which time she states that she has had epidurals to help with her pain.  Physical exam with low  suspicion of cauda equina per her physical and history.  X-rays were negative for acute compression fracture.  Patient was made aware and she is to follow-up with Dr. Sharlet Salina for continued back pain.  She will continue with her regular medications including gabapentin.  We also added meloxicam 15 mg 1 daily with food and Lidoderm patches.      Patient's presentation is most consistent with acute complicated illness / injury requiring diagnostic workup.  FINAL CLINICAL IMPRESSION(S) / ED DIAGNOSES   Final diagnoses:  Chronic bilateral low back pain without sciatica  Acute exacerbation of chronic low back pain     Rx / DC Orders   ED Discharge Orders          Ordered    meloxicam (MOBIC) 15 MG tablet  Daily        08/18/22 1123    lidocaine (LIDODERM) 5 %  Every 12 hours        08/18/22 1123             Note:  This document was prepared using Dragon  voice recognition software and may include unintentional dictation errors.   Johnn Hai, PA-C 08/18/22 1153    Lavonia Drafts, MD 08/18/22 1325

## 2022-09-19 ENCOUNTER — Other Ambulatory Visit: Payer: Self-pay

## 2022-09-19 ENCOUNTER — Emergency Department
Admission: EM | Admit: 2022-09-19 | Discharge: 2022-09-20 | Disposition: A | Discharge status: Home / Self Care | Payer: 59 | Attending: Emergency Medicine | Admitting: Emergency Medicine

## 2022-09-19 ENCOUNTER — Emergency Department: Payer: 59

## 2022-09-19 DIAGNOSIS — Z7984 Long term (current) use of oral hypoglycemic drugs: Secondary | ICD-10-CM | POA: Insufficient documentation

## 2022-09-19 DIAGNOSIS — Y990 Civilian activity done for income or pay: Secondary | ICD-10-CM | POA: Insufficient documentation

## 2022-09-19 DIAGNOSIS — M79602 Pain in left arm: Secondary | ICD-10-CM | POA: Insufficient documentation

## 2022-09-19 DIAGNOSIS — Z8616 Personal history of COVID-19: Secondary | ICD-10-CM | POA: Diagnosis not present

## 2022-09-19 DIAGNOSIS — E119 Type 2 diabetes mellitus without complications: Secondary | ICD-10-CM | POA: Insufficient documentation

## 2022-09-19 DIAGNOSIS — Z87891 Personal history of nicotine dependence: Secondary | ICD-10-CM | POA: Diagnosis not present

## 2022-09-19 DIAGNOSIS — Z21 Asymptomatic human immunodeficiency virus [HIV] infection status: Secondary | ICD-10-CM | POA: Insufficient documentation

## 2022-09-19 DIAGNOSIS — J45909 Unspecified asthma, uncomplicated: Secondary | ICD-10-CM | POA: Insufficient documentation

## 2022-09-19 DIAGNOSIS — I10 Essential (primary) hypertension: Secondary | ICD-10-CM | POA: Insufficient documentation

## 2022-09-19 DIAGNOSIS — T148XXA Other injury of unspecified body region, initial encounter: Secondary | ICD-10-CM

## 2022-09-19 DIAGNOSIS — X500XXA Overexertion from strenuous movement or load, initial encounter: Secondary | ICD-10-CM | POA: Insufficient documentation

## 2022-09-19 DIAGNOSIS — Z79899 Other long term (current) drug therapy: Secondary | ICD-10-CM | POA: Insufficient documentation

## 2022-09-19 MED ORDER — METHOCARBAMOL 750 MG PO TABS: 750.0000 mg | ORAL_TABLET | Freq: Four times a day (QID) | ORAL | 0 refills | Status: AC | PRN

## 2022-09-19 NOTE — ED Triage Notes (Signed)
Pt to ED via POV c/o left arm pain. Pt reports throbbing pain to left arm that started yesterday and has not gone away. Only relief is when she raises her arm up. +radial pulses. Denies any injury to arm

## 2022-09-19 NOTE — Discharge Instructions (Signed)
You have been seen today in the emergency room for complaint of left arm pain.  After reviewing your x-ray results in conjunction with your exam, it is determined that you have a muscular strain to the left arm.  I will be providing you with a short course of muscle relaxers.  You can take 1 every 6 hours as needed for the pain.  If pain persist or worsens please follow-up with your primary care provider.
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
## 2022-09-20 NOTE — ED Notes (Signed)
Pt up for discharge but left before receiving discharge papers or departing vitals. Provider notified and stated that the patient was aware of her prescriptions at the pharmacy.

## 2023-02-12 ENCOUNTER — Emergency Department
Admission: EM | Admit: 2023-02-12 | Discharge: 2023-02-12 | Disposition: A | Discharge status: Home / Self Care | Payer: 59 | Attending: Emergency Medicine | Admitting: Emergency Medicine

## 2023-02-12 ENCOUNTER — Other Ambulatory Visit: Payer: Self-pay

## 2023-02-12 ENCOUNTER — Encounter: Payer: Self-pay | Admitting: Emergency Medicine

## 2023-02-12 DIAGNOSIS — E119 Type 2 diabetes mellitus without complications: Secondary | ICD-10-CM | POA: Diagnosis not present

## 2023-02-12 DIAGNOSIS — R600 Localized edema: Secondary | ICD-10-CM | POA: Insufficient documentation

## 2023-02-12 DIAGNOSIS — M7989 Other specified soft tissue disorders: Secondary | ICD-10-CM | POA: Diagnosis present

## 2023-02-12 DIAGNOSIS — Z21 Asymptomatic human immunodeficiency virus [HIV] infection status: Secondary | ICD-10-CM | POA: Insufficient documentation

## 2023-02-12 LAB — CBC WITH DIFFERENTIAL/PLATELET
Abs Immature Granulocytes: 0.02 10*3/uL (ref 0.00–0.07)
Basophils Absolute: 0 10*3/uL (ref 0.0–0.1)
Basophils Relative: 1 %
Eosinophils Absolute: 0.1 10*3/uL (ref 0.0–0.5)
Eosinophils Relative: 2 %
HCT: 35.2 % — ABNORMAL LOW (ref 36.0–46.0)
Hemoglobin: 11.7 g/dL — ABNORMAL LOW (ref 12.0–15.0)
Immature Granulocytes: 0 %
Lymphocytes Relative: 31 %
Lymphs Abs: 2 10*3/uL (ref 0.7–4.0)
MCH: 29.5 pg (ref 26.0–34.0)
MCHC: 33.2 g/dL (ref 30.0–36.0)
MCV: 88.9 fL (ref 80.0–100.0)
Monocytes Absolute: 0.4 10*3/uL (ref 0.1–1.0)
Monocytes Relative: 7 %
Neutro Abs: 3.8 10*3/uL (ref 1.7–7.7)
Neutrophils Relative %: 59 %
Platelets: 316 10*3/uL (ref 150–400)
RBC: 3.96 MIL/uL (ref 3.87–5.11)
RDW: 13.8 % (ref 11.5–15.5)
WBC: 6.4 10*3/uL (ref 4.0–10.5)
nRBC: 0 % (ref 0.0–0.2)

## 2023-02-12 LAB — COMPREHENSIVE METABOLIC PANEL
ALT: 25 U/L (ref 0–44)
AST: 23 U/L (ref 15–41)
Albumin: 4 g/dL (ref 3.5–5.0)
Alkaline Phosphatase: 71 U/L (ref 38–126)
Anion gap: 16 — ABNORMAL HIGH (ref 5–15)
BUN: 16 mg/dL (ref 8–23)
CO2: 27 mmol/L (ref 22–32)
Calcium: 9.6 mg/dL (ref 8.9–10.3)
Chloride: 102 mmol/L (ref 98–111)
Creatinine, Ser: 0.85 mg/dL (ref 0.44–1.00)
GFR, Estimated: >60 mL/min (ref >60)
Glucose, Bld: 107 mg/dL — ABNORMAL HIGH (ref 70–99)
Potassium: 3.5 mmol/L (ref 3.5–5.1)
Sodium: 145 mmol/L (ref 135–145)
Total Bilirubin: 0.5 mg/dL (ref 0.3–1.2)
Total Protein: 7.6 g/dL (ref 6.5–8.1)

## 2023-02-12 NOTE — ED Notes (Signed)
 Pt denied being able to produce a urine sample at this time. Pt provided with a labeled specimen cup and instructions to return cup to triage nurse desk once it has a clean catch urine sample.

## 2023-02-12 NOTE — ED Provider Notes (Signed)
[facility] Provider Note    Event Date/Time   First MD Initiated Contact with Patient 02/12/23 [phone]     (approximate)   History   Leg Swelling   HPI  Sara Mcintyre is a 62 y.o. female with a history of HIV, GERD, diabetes, schizophrenia who comes the ED complaining of swelling at bilateral lower legs for the past 3 days.  Gradual onset, worse with standing upright at work.  Better when sitting with legs elevated.  Denies chest pain or shortness of breath.  No change in urination.  No fever.  Denies trauma.     Physical Exam   Triage Vital Signs: ED Triage Vitals  Encounter Vitals Group     BP 02/12/23 0546 130/73     Systolic BP Percentile --      Diastolic BP Percentile --      Pulse Rate 02/12/23 0546 78     Resp 02/12/23 0546 15     Temp 02/12/23 0546 98.5 F (36.9 C)     Temp Source 02/12/23 0546 Oral     SpO2 02/12/23 0546 99 %     Weight 02/12/23 0545 170 lb (77.1 kg)     Height 02/12/23 0545 5\' 2"  (1.575 m)     Head Circumference --      Peak Flow --      Pain Score 02/12/23 0545 8     Pain Loc --      Pain Education --      Exclude from Growth Chart --     Most recent vital signs: Vitals:   02/12/23 0546  BP: 130/73  Pulse: 78  Resp: 15  Temp: 98.5 F (36.9 C)  SpO2: 99%    General: Awake, no distress.  CV:  Good peripheral perfusion.  Regular rate rhythm Resp:  Normal effort.  Clear to auscultation bilaterally Abd:  No distention.  Soft nontender Other:  Trace pitting edema at bilateral ankles and feet   ED Results / Procedures / Treatments   Labs (all labs ordered are listed, but only abnormal results are displayed) Labs Reviewed  CBC WITH DIFFERENTIAL/PLATELET - Abnormal; Notable for the following components:      Result Value   Hemoglobin 11.7 (*)    HCT 35.2 (*)    All other components within normal limits  COMPREHENSIVE METABOLIC PANEL - Abnormal; Notable for the following components:   Glucose,  Bld 107 (*)    Anion gap 16 (*)    All other components within normal limits  URINALYSIS, ROUTINE W REFLEX MICROSCOPIC     RADIOLOGY    PROCEDURES:  Procedures   MEDICATIONS ORDERED IN ED: Medications - No data to display   IMPRESSION / MDM / ASSESSMENT AND PLAN / ED COURSE  I reviewed the triage vital signs and the nursing notes.                              Differential diagnosis includes, but is not limited to, AKI, electrolyte abnormality, anemia, peripheral edema.  Doubt ACS PE dissection acute heart failure DVT or infection.  Labs unremarkable, vital signs normal.  Legs are totally symmetric.  Exam reassuring.  Recommend leg elevation, frequent ambulation, low-sodium diet, follow-up with primary care.       FINAL CLINICAL IMPRESSION(S) / ED DIAGNOSES   Final diagnoses:  Peripheral edema     Rx / DC Orders   ED Discharge  Orders     None        Note:  This document was prepared using Dragon voice recognition software and may include unintentional dictation errors.   Sharman Cheek, MD 02/12/23 [phone]

## 2023-02-12 NOTE — ED Triage Notes (Signed)
Patient ambulatory to triage with steady gait, without difficulty or distress noted; pt reports LE(s) swelling and pain for several days; denies hx of same, denies any accomp symptoms

## 2023-03-18 ENCOUNTER — Other Ambulatory Visit: Payer: Self-pay | Admitting: Nurse Practitioner

## 2023-03-18 DIAGNOSIS — Z1231 Encounter for screening mammogram for malignant neoplasm of breast: Secondary | ICD-10-CM

## 2023-04-30 ENCOUNTER — Other Ambulatory Visit: Payer: 59

## 2023-05-31 ENCOUNTER — Other Ambulatory Visit: Payer: Self-pay

## 2023-05-31 ENCOUNTER — Emergency Department: Payer: 59

## 2023-05-31 ENCOUNTER — Emergency Department
Admission: EM | Admit: 2023-05-31 | Discharge: 2023-05-31 | Disposition: A | Discharge status: Home / Self Care | Payer: 59 | Attending: Emergency Medicine | Admitting: Emergency Medicine

## 2023-05-31 DIAGNOSIS — J45909 Unspecified asthma, uncomplicated: Secondary | ICD-10-CM | POA: Diagnosis not present

## 2023-05-31 DIAGNOSIS — N390 Urinary tract infection, site not specified: Secondary | ICD-10-CM | POA: Diagnosis not present

## 2023-05-31 DIAGNOSIS — R079 Chest pain, unspecified: Secondary | ICD-10-CM | POA: Insufficient documentation

## 2023-05-31 DIAGNOSIS — Z21 Asymptomatic human immunodeficiency virus [HIV] infection status: Secondary | ICD-10-CM | POA: Diagnosis not present

## 2023-05-31 DIAGNOSIS — R42 Dizziness and giddiness: Secondary | ICD-10-CM | POA: Diagnosis present

## 2023-05-31 DIAGNOSIS — I10 Essential (primary) hypertension: Secondary | ICD-10-CM | POA: Insufficient documentation

## 2023-05-31 LAB — CBC
HCT: 39.8 % (ref 36.0–46.0)
Hemoglobin: 12.7 g/dL (ref 12.0–15.0)
MCH: 28.2 pg (ref 26.0–34.0)
MCHC: 31.9 g/dL (ref 30.0–36.0)
MCV: 88.2 fL (ref 80.0–100.0)
Platelets: 299 10*3/uL (ref 150–400)
RBC: 4.51 MIL/uL (ref 3.87–5.11)
RDW: 14.6 % (ref 11.5–15.5)
WBC: 7.6 10*3/uL (ref 4.0–10.5)
nRBC: 0 % (ref 0.0–0.2)

## 2023-05-31 LAB — URINALYSIS, ROUTINE W REFLEX MICROSCOPIC
Bacteria, UA: NONE SEEN
Bilirubin Urine: NEGATIVE
Glucose, UA: NEGATIVE mg/dL
Ketones, ur: NEGATIVE mg/dL
Nitrite: NEGATIVE
Protein, ur: 30 mg/dL — AB
RBC / HPF: >50 RBC/hpf (ref 0–5)
Specific Gravity, Urine: 1.027 (ref 1.005–1.030)
WBC, UA: >50 WBC/hpf (ref 0–5)
pH: 5 (ref 5.0–8.0)

## 2023-05-31 LAB — BASIC METABOLIC PANEL
Anion gap: 9 (ref 5–15)
BUN: 27 mg/dL — ABNORMAL HIGH (ref 8–23)
CO2: 25 mmol/L (ref 22–32)
Calcium: 9.3 mg/dL (ref 8.9–10.3)
Chloride: 106 mmol/L (ref 98–111)
Creatinine, Ser: 1.15 mg/dL — ABNORMAL HIGH (ref 0.44–1.00)
GFR, Estimated: 54 mL/min — ABNORMAL LOW (ref >60)
Glucose, Bld: 118 mg/dL — ABNORMAL HIGH (ref 70–99)
Potassium: 4 mmol/L (ref 3.5–5.1)
Sodium: 140 mmol/L (ref 135–145)

## 2023-05-31 LAB — TROPONIN I (HIGH SENSITIVITY)
Troponin I (High Sensitivity): 5 ng/L (ref <18)
Troponin I (High Sensitivity): 4 ng/L (ref <18)

## 2023-05-31 MED ORDER — FOSFOMYCIN TROMETHAMINE 3 G PO PACK
3.0000 g | PACK | ORAL | Status: AC
  Administered 2023-05-31: 3 g via ORAL
  Filled 2023-05-31: qty 3

## 2023-05-31 NOTE — ED Triage Notes (Signed)
Pt to ED via [facility] from work c/o dizziness that started after she woke up around 8pm for work. Pt also endorses central CP that started while she was waiting for triage. Pt denies any recent illness. Denies any N/V/D, SOB, fevers. Hx of HTN and diabetes   CBG 153  139/77 73HR 99%RA 16RR

## 2023-05-31 NOTE — ED Provider Notes (Signed)
[facility] Provider Note   Event Date/Time   First MD Initiated Contact with Patient 05/31/23 [phone]     (approximate)  History   Dizziness and Chest Pain   HPI  Sara Mcintyre is a 62 y.o. female history of asthma, HIV, hypertension   She started feeling lightheaded about 8 PM.  She first noticed it when she stood up after eating.  It lasted briefly just a sensation like she was a little lightheaded.  No weakness in the arm or leg no pain no headache.  She was at work, she also continue to feel little bit lightheaded throughout her shift at the [employer].  When asked if she had chest pain earlier she reports that she is fine she is not having any chest pain.  Triage note indicates that the patient had episode of chest pain at triage, but the patient does not  indicate that  No nausea vomiting no fevers or chills.  She reports she has had a little bit of urinary discomfort since yesterday though.  Physical Exam   Triage Vital Signs: ED Triage Vitals  Encounter Vitals Group     BP 05/31/23 0248 115/70     Systolic BP Percentile --      Diastolic BP Percentile --      Pulse Rate 05/31/23 0248 70     Resp 05/31/23 0248 17     Temp 05/31/23 0248 98.1 F (36.7 C)     Temp Source 05/31/23 0248 Oral     SpO2 05/31/23 0248 100 %     Weight 05/31/23 0246 174 lb (78.9 kg)     Height 05/31/23 0658 5\' 2"  (1.575 m)     Head Circumference --      Peak Flow --      Pain Score 05/31/23 0246 6     Pain Loc --      Pain Education --      Exclude from Growth Chart --     Most recent vital signs: Vitals:   05/31/23 0248 05/31/23 0659  BP: 115/70 121/69  Pulse: 70 65  Resp: 17 18  Temp: 98.1 F (36.7 C) 98 F (36.7 C)  SpO2: 100% 100%     General: Awake, no distress.  CV:  Good peripheral perfusion.  Normal tones and rate Resp:  Normal effort.  Clear bilateral Abd:  No distention.  Other:  Moves all extremities well.  Speech is clear  and fluent.  5-5 strength all extremities.  Uses her phone without difficulty.  No nystagmus.   ED Results / Procedures / Treatments   Labs (all labs ordered are listed, but only abnormal results are displayed) Labs Reviewed  BASIC METABOLIC PANEL - Abnormal; Notable for the following components:      Result Value   Glucose, Bld 118 (*)    BUN 27 (*)    Creatinine, Ser 1.15 (*)    GFR, Estimated 54 (*)    All other components within normal limits  URINALYSIS, ROUTINE W REFLEX MICROSCOPIC - Abnormal; Notable for the following components:   Color, Urine YELLOW (*)    APPearance CLOUDY (*)    Hgb urine dipstick MODERATE (*)    Protein, ur 30 (*)    Leukocytes,Ua MODERATE (*)    All other components within normal limits  URINE CULTURE  CBC  TROPONIN I (HIGH SENSITIVITY)  TROPONIN I (HIGH SENSITIVITY)   CBC normal initial troponin normal.  Metabolic panel without  acute anomaly except for minimally reduced GFR  EKG  Inter by me at 3 AM heart rate 80 QRS 90 QTc 440 Normal sinus rhythm no evidence of ischemia   RADIOLOGY Chest x-ray interpreted by me as negative for acute finding    PROCEDURES:  Critical Care performed: No  Procedures   MEDICATIONS ORDERED IN ED: Medications  fosfomycin (MONUROL) packet 3 g (has no administration in time range)     IMPRESSION / MDM / ASSESSMENT AND PLAN / ED COURSE  I reviewed the triage vital signs and the nursing notes.                              Differential diagnosis includes, but is not limited to, possible mild dehydration, electrolyte abnormality insidious anemia, orthostasis especially given the symptoms occurred with standing, etc.  She has no acute cardiac symptoms she reported he chest pain at triage, her EKG and troponin are normal.  Her labs reveal a very slight reduction in GFR.  Question if she may be just slightly dehydrated.  Patient agreeable to oral hydration, currently have a national shortage of IV  fluid  Urinalysis white cells.  Will send for culture.  With symptomatology suspect likely early uncomplicated UTI  No central focal findings on neurologic examination.  CT head ordered to evaluate given the patient's age greater than 60, complaint of dizziness that is not otherwise well elucidated by examination and lab testing at this point  Patient's presentation is most consistent with acute complicated illness / injury requiring diagnostic workup.      Clinical Course as of 05/31/23 0936  Mon May 31, 2023  9528 CT head CT Head Wo Contrast Result Date: 05/31/2023 CLINICAL DATA:  Vertigo, peripheral. EXAM: CT HEAD WITHOUT CONTRAST TECHNIQUE: Contiguous axial images were obtained from the base of the skull through the vertex without intravenous contrast. RADIATION DOSE REDUCTION: This exam was performed according to the departmental dose-optimization program which includes automated exposure control, adjustment of the mA and/or kV according to patient size and/or use of iterative reconstruction technique. COMPARISON:  Head CT 09/12/2021 FINDINGS: Brain: There is no evidence of an acute infarct, intracranial hemorrhage, mass, midline shift, or extra-axial fluid collection. The ventricles and sulci are normal. Vascular: No hyperdense vessel. Skull: No acute fracture or suspicious osseous lesion. Sinuses/Orbits: Chronic right maxillary sinusitis. Clear mastoid air cells. Unremarkable orbits. Other: None. IMPRESSION: No evidence of acute intracranial abnormality. Electronically Signed   By: Sebastian Ache M.D.   On: 05/31/2023 07:56      [MQ]    Clinical Course User Index [MQ] Sharyn Creamer, MD   ----------------------------------------- 9:35 AM on 05/31/2023 ----------------------------------------- Patient very pleasant, smiling.  Has been hydrating well.  Discussed further concerns of early urinary tract infection, patient understanding.  Discussed with her we will treat with single dose of  fosfomycin.  She will monitor for any symptoms of improvement or worsening.  Will return to the ER if worsening symptoms, increasing fatigue, high fevers chills body aches severe pain etc.  Return precautions and treatment recommendations and follow-up discussed with the patient who is agreeable with the plan.    FINAL CLINICAL IMPRESSION(S) / ED DIAGNOSES   Final diagnoses:  Lightheadedness  Urinary tract infection, acute     Rx / DC Orders   ED Discharge Orders     None        Note:  This document was prepared using Dragon voice  recognition software and may include unintentional dictation errors.   Sharyn Creamer, MD 05/31/23 [phone]

## 2023-06-01 LAB — URINE CULTURE: Culture: >=100000 — AB

## 2023-06-02 ENCOUNTER — Inpatient Hospital Stay: Admission: RE | Admit: 2023-06-02 | Payer: 59 | Source: Ambulatory Visit

## 2024-01-06 LAB — GLUCOSE, POCT (MANUAL RESULT ENTRY): POC Glucose: 147 mg/dL — AB (ref 70–99)
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
## 2024-02-23 ENCOUNTER — Other Ambulatory Visit: Payer: Self-pay | Admitting: Nurse Practitioner

## 2024-02-23 DIAGNOSIS — Z1231 Encounter for screening mammogram for malignant neoplasm of breast: Secondary | ICD-10-CM

## 2024-04-04 ENCOUNTER — Ambulatory Visit
Admission: RE | Admit: 2024-04-04 | Discharge: 2024-04-04 | Disposition: A | Discharge status: Home / Self Care | Source: Ambulatory Visit | Attending: Nurse Practitioner | Admitting: Nurse Practitioner

## 2024-04-04 DIAGNOSIS — Z1231 Encounter for screening mammogram for malignant neoplasm of breast: Secondary | ICD-10-CM | POA: Diagnosis present
